# Patient Record
Sex: Female | Born: 1955 | Race: White | Hispanic: No | Marital: Married | Smoking: Former smoker
Health system: Southern US, Community
[De-identification: ages and names within clinical notes are randomized; demographics above are authoritative.]

## PROBLEM LIST (undated history)

## (undated) DIAGNOSIS — I10 Essential (primary) hypertension: Secondary | ICD-10-CM

## (undated) DIAGNOSIS — F32A Depression, unspecified: Secondary | ICD-10-CM

## (undated) DIAGNOSIS — K219 Gastro-esophageal reflux disease without esophagitis: Secondary | ICD-10-CM

## (undated) DIAGNOSIS — J45909 Unspecified asthma, uncomplicated: Secondary | ICD-10-CM

## (undated) DIAGNOSIS — F329 Major depressive disorder, single episode, unspecified: Secondary | ICD-10-CM

## (undated) DIAGNOSIS — M1990 Unspecified osteoarthritis, unspecified site: Secondary | ICD-10-CM

## (undated) DIAGNOSIS — E119 Type 2 diabetes mellitus without complications: Secondary | ICD-10-CM

## (undated) DIAGNOSIS — L98439 Non-pressure chronic ulcer of abdomen with unspecified severity: Secondary | ICD-10-CM

## (undated) DIAGNOSIS — K769 Liver disease, unspecified: Secondary | ICD-10-CM

## (undated) DIAGNOSIS — E7800 Pure hypercholesterolemia, unspecified: Secondary | ICD-10-CM

## (undated) DIAGNOSIS — L98499 Non-pressure chronic ulcer of skin of other sites with unspecified severity: Secondary | ICD-10-CM

## (undated) HISTORY — PX: CHOLECYSTECTOMY: SHX55

## (undated) HISTORY — DX: Liver disease, unspecified: K76.9

## (undated) HISTORY — PX: ABDOMINAL HYSTERECTOMY: SHX81

---
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
## 2001-11-14 ENCOUNTER — Ambulatory Visit (HOSPITAL_COMMUNITY): Admission: RE | Admit: 2001-11-14 | Discharge: 2001-11-14 | Payer: Self-pay | Admitting: General Surgery

## 2001-11-14 ENCOUNTER — Encounter: Payer: Self-pay | Admitting: General Surgery

## 2002-11-18 ENCOUNTER — Encounter: Payer: Self-pay | Admitting: General Surgery

## 2002-11-18 ENCOUNTER — Ambulatory Visit (HOSPITAL_COMMUNITY): Admission: RE | Admit: 2002-11-18 | Discharge: 2002-11-18 | Payer: Self-pay | Admitting: Pediatrics

## 2003-12-02 ENCOUNTER — Ambulatory Visit (HOSPITAL_COMMUNITY): Admission: RE | Admit: 2003-12-02 | Discharge: 2003-12-02 | Payer: Self-pay | Admitting: General Surgery

## 2005-04-04 ENCOUNTER — Ambulatory Visit (HOSPITAL_COMMUNITY): Admission: RE | Admit: 2005-04-04 | Discharge: 2005-04-04 | Payer: Self-pay | Admitting: General Surgery

## 2006-05-14 ENCOUNTER — Ambulatory Visit (HOSPITAL_COMMUNITY): Admission: RE | Admit: 2006-05-14 | Discharge: 2006-05-14 | Payer: Self-pay | Admitting: General Surgery

## 2007-01-14 ENCOUNTER — Emergency Department (HOSPITAL_COMMUNITY): Admission: EM | Admit: 2007-01-14 | Discharge: 2007-01-14 | Payer: Self-pay | Admitting: Emergency Medicine

## 2007-06-28 ENCOUNTER — Ambulatory Visit (HOSPITAL_COMMUNITY): Admission: RE | Admit: 2007-06-28 | Discharge: 2007-06-28 | Payer: Self-pay | Admitting: General Surgery

## 2008-01-31 ENCOUNTER — Observation Stay (HOSPITAL_COMMUNITY): Admission: RE | Admit: 2008-01-31 | Discharge: 2008-02-01 | Payer: Self-pay | Admitting: Internal Medicine

## 2008-01-31 ENCOUNTER — Ambulatory Visit: Payer: Self-pay | Admitting: Internal Medicine

## 2008-01-31 ENCOUNTER — Ambulatory Visit: Payer: Self-pay | Admitting: Urgent Care

## 2008-02-01 ENCOUNTER — Ambulatory Visit: Payer: Self-pay | Admitting: Internal Medicine

## 2008-04-14 ENCOUNTER — Ambulatory Visit: Payer: Self-pay | Admitting: Internal Medicine

## 2008-04-24 ENCOUNTER — Ambulatory Visit: Payer: Self-pay | Admitting: Internal Medicine

## 2008-04-24 ENCOUNTER — Ambulatory Visit (HOSPITAL_COMMUNITY): Admission: RE | Admit: 2008-04-24 | Discharge: 2008-04-24 | Payer: Self-pay | Admitting: Internal Medicine

## 2008-11-25 ENCOUNTER — Ambulatory Visit (HOSPITAL_COMMUNITY): Admission: RE | Admit: 2008-11-25 | Discharge: 2008-11-25 | Payer: Self-pay | Admitting: General Surgery

## 2009-12-10 ENCOUNTER — Ambulatory Visit (HOSPITAL_COMMUNITY): Admission: RE | Admit: 2009-12-10 | Discharge: 2009-12-10 | Payer: Self-pay | Admitting: General Surgery

## 2010-04-17 ENCOUNTER — Inpatient Hospital Stay (HOSPITAL_COMMUNITY): Admission: EM | Admit: 2010-04-17 | Discharge: 2010-04-18 | Payer: Self-pay | Admitting: Emergency Medicine

## 2010-05-20 DIAGNOSIS — R188 Other ascites: Secondary | ICD-10-CM

## 2010-05-20 DIAGNOSIS — Z8719 Personal history of other diseases of the digestive system: Secondary | ICD-10-CM | POA: Insufficient documentation

## 2010-05-20 DIAGNOSIS — Z8711 Personal history of peptic ulcer disease: Secondary | ICD-10-CM | POA: Insufficient documentation

## 2010-05-20 DIAGNOSIS — K449 Diaphragmatic hernia without obstruction or gangrene: Secondary | ICD-10-CM | POA: Insufficient documentation

## 2010-05-20 DIAGNOSIS — E119 Type 2 diabetes mellitus without complications: Secondary | ICD-10-CM

## 2010-07-21 ENCOUNTER — Emergency Department (HOSPITAL_COMMUNITY)
Admission: EM | Admit: 2010-07-21 | Discharge: 2010-07-22 | Payer: Self-pay | Source: Home / Self Care | Admitting: Emergency Medicine

## 2010-07-29 ENCOUNTER — Emergency Department: Payer: Self-pay | Admitting: Emergency Medicine

## 2010-09-04 ENCOUNTER — Encounter: Payer: Self-pay | Admitting: General Surgery

## 2010-10-25 LAB — URINALYSIS, ROUTINE W REFLEX MICROSCOPIC
Bilirubin Urine: NEGATIVE
Ketones, ur: NEGATIVE mg/dL
Urobilinogen, UA: 0.2 mg/dL (ref 0.0–1.0)

## 2010-10-25 LAB — URINE MICROSCOPIC-ADD ON

## 2010-10-25 LAB — POCT I-STAT, CHEM 8
BUN: 16 mg/dL (ref 6–23)
Chloride: 103 mEq/L (ref 96–112)
HCT: 43 % (ref 36.0–46.0)
TCO2: 26 mmol/L (ref 0–100)

## 2010-10-25 LAB — GLUCOSE, CAPILLARY: Glucose-Capillary: 234 mg/dL — ABNORMAL HIGH (ref 70–99)

## 2010-10-27 LAB — DIFFERENTIAL
Basophils Absolute: 0 10*3/uL (ref 0.0–0.1)
Basophils Absolute: 0 10*3/uL (ref 0.0–0.1)
Basophils Relative: 1 % (ref 0–1)
Basophils Relative: 1 % (ref 0–1)
Eosinophils Absolute: 0.2 10*3/uL (ref 0.0–0.7)
Eosinophils Relative: 3 % (ref 0–5)
Eosinophils Relative: 3 % (ref 0–5)
Lymphocytes Relative: 38 % (ref 12–46)
Monocytes Absolute: 0.7 10*3/uL (ref 0.1–1.0)
Monocytes Relative: 9 % (ref 3–12)
Neutro Abs: 4.7 10*3/uL (ref 1.7–7.7)
Neutrophils Relative %: 61 % (ref 43–77)

## 2010-10-27 LAB — POCT CARDIAC MARKERS: CKMB, poc: <1 ng/mL — ABNORMAL LOW (ref 1.0–8.0)

## 2010-10-27 LAB — CK TOTAL AND CKMB (NOT AT ARMC)
CK, MB: 1.1 ng/mL (ref 0.3–4.0)
CK, MB: 1.5 ng/mL (ref 0.3–4.0)
CK, MB: 1.5 ng/mL (ref 0.3–4.0)
Relative Index: INVALID (ref 0.0–2.5)
Relative Index: INVALID (ref 0.0–2.5)
Total CK: 71 U/L (ref 7–177)

## 2010-10-27 LAB — URINALYSIS, ROUTINE W REFLEX MICROSCOPIC
Glucose, UA: NEGATIVE mg/dL
Specific Gravity, Urine: <1.005 — ABNORMAL LOW (ref 1.005–1.030)
pH: 5.5 (ref 5.0–8.0)

## 2010-10-27 LAB — LIPID PANEL
HDL: 48 mg/dL (ref >39)
LDL Cholesterol: 89 mg/dL (ref 0–99)
Total CHOL/HDL Ratio: 4.1 RATIO
Triglycerides: 298 mg/dL — ABNORMAL HIGH (ref <150)
VLDL: 60 mg/dL — ABNORMAL HIGH (ref 0–40)

## 2010-10-27 LAB — BASIC METABOLIC PANEL
BUN: 9 mg/dL (ref 6–23)
Chloride: 105 mEq/L (ref 96–112)
Creatinine, Ser: 0.83 mg/dL (ref 0.4–1.2)
GFR calc non Af Amer: >60 mL/min (ref >60)
Glucose, Bld: 153 mg/dL — ABNORMAL HIGH (ref 70–99)

## 2010-10-27 LAB — COMPREHENSIVE METABOLIC PANEL
ALT: 30 U/L (ref 0–35)
Albumin: 4.2 g/dL (ref 3.5–5.2)
Alkaline Phosphatase: 81 U/L (ref 39–117)
BUN: 15 mg/dL (ref 6–23)
Creatinine, Ser: 0.98 mg/dL (ref 0.4–1.2)
Glucose, Bld: 165 mg/dL — ABNORMAL HIGH (ref 70–99)
Sodium: 135 mEq/L (ref 135–145)
Total Protein: 7.6 g/dL (ref 6.0–8.3)

## 2010-10-27 LAB — CBC
MCHC: 34 g/dL (ref 30.0–36.0)
MCV: 85.9 fL (ref 78.0–100.0)
Platelets: 229 10*3/uL (ref 150–400)
Platelets: 244 10*3/uL (ref 150–400)
RBC: 4.27 MIL/uL (ref 3.87–5.11)
RDW: 14.2 % (ref 11.5–15.5)
RDW: 14.2 % (ref 11.5–15.5)
WBC: 6 10*3/uL (ref 4.0–10.5)

## 2010-10-27 LAB — GLUCOSE, CAPILLARY
Glucose-Capillary: 103 mg/dL — ABNORMAL HIGH (ref 70–99)
Glucose-Capillary: 143 mg/dL — ABNORMAL HIGH (ref 70–99)
Glucose-Capillary: 156 mg/dL — ABNORMAL HIGH (ref 70–99)

## 2010-10-27 LAB — HEMOCCULT GUIAC POC 1CARD (OFFICE)
Fecal Occult Bld: NEGATIVE
Fecal Occult Bld: NEGATIVE

## 2010-10-27 LAB — MRSA PCR SCREENING: MRSA by PCR: NEGATIVE

## 2010-10-27 LAB — URINE CULTURE: Culture  Setup Time: 201109042126

## 2010-10-27 LAB — PROTIME-INR
INR: 0.96 (ref 0.00–1.49)
Prothrombin Time: 12.7 seconds (ref 11.6–15.2)
Prothrombin Time: 13 seconds (ref 11.6–15.2)

## 2010-10-27 LAB — TSH: TSH: 1.308 u[IU]/mL (ref 0.350–4.500)

## 2010-10-27 LAB — TYPE AND SCREEN: ABO/RH(D): O POS

## 2010-11-21 ENCOUNTER — Other Ambulatory Visit (HOSPITAL_COMMUNITY): Payer: Self-pay | Admitting: General Surgery

## 2010-11-21 DIAGNOSIS — Z139 Encounter for screening, unspecified: Secondary | ICD-10-CM

## 2010-12-12 ENCOUNTER — Ambulatory Visit (HOSPITAL_COMMUNITY)
Admission: RE | Admit: 2010-12-12 | Discharge: 2010-12-12 | Disposition: A | Discharge status: Home / Self Care | Payer: BC Managed Care – PPO | Source: Ambulatory Visit | Attending: General Surgery | Admitting: General Surgery

## 2010-12-12 DIAGNOSIS — Z139 Encounter for screening, unspecified: Secondary | ICD-10-CM

## 2010-12-12 DIAGNOSIS — Z1231 Encounter for screening mammogram for malignant neoplasm of breast: Secondary | ICD-10-CM | POA: Insufficient documentation

## 2010-12-27 NOTE — Consult Note (Signed)
Heather Carter, Heather Carter              ACCOUNT NO.:  [id number]   MEDICAL RECORD NO.:  [id number]          PATIENT TYPE:  AMB   LOCATION:  DAY                           FACILITY:  [facility]   PHYSICIAN:  R. Roetta Sessions, M.D. DATE OF BIRTH:  [birth year]   DATE OF CONSULTATION:  01/31/2008  DATE OF DISCHARGE:                                 CONSULTATION   REQUESTING PHYSICIAN:  Terrilee Files, NP, [facility].   REASON FOR CONSULTATION:  Melena.   HISTORY OF PRESENT ILLNESS:  Heather Carter is a 55 year old Caucasian  female with a 2-day history of melena.  She began to notice black tarry  stools approximately 2 days ago.  She is having anywhere from 5-6 dark  tarry stools per day.  She has been having some shortness of breath on  exertion and weakness as well as palpitations which is what led her to  see someone yesterday.  She was found to have a hemoglobin of 10.7.  STAT CBC today showed a hemoglobin of 10.1.  She did give blood on January 14, 2008, as well prior to the onset of her melena.  She complains of  epigastric tenderness and a pressure.  At first, she felt like she was  having anxiety.  She complains of nausea but denies any vomiting.  She  has had heartburn and indigestion.  She has been taking Prilosec 20 mg  daily for about 3 weeks now.  She does have some odynophagia as well as  dysphagia.  She feels like there is a lump when she swallows, and she  points midsternally.  She is having problems with both solids and  liquids.  She typically has 2-3 soft brown bowel movements per day.  Her  stools have been very irregular over the last 3 weeks.  She take 2 Aleve  just prior to the onset of her melena, and then she took 2 Aleve again  yesterday for left thumb pain.  She last ate bananas and Slim-Fast about  7:30 a.m.  She was found to be hemoccult positive at [facility].   PAST MEDICAL AND SURGICAL HISTORY:  1. Hypertension.  2. Diabetes.  3.  Lower extremity edema.  4. Hiatal hernia.  5. She had a cholecystectomy in 1997 for cholelithiasis.  6. She had a complete hysterectomy in 1989.   CURRENT MEDICATIONS:  1. Atenolol 50 mg daily.  2. Triamterine/HCTZ unknown dose daily.  3. Metformin 1 g daily.  4. Aleve p.r.n.  5. Prilosec 20 mg daily p.r.n.   ALLERGIES:  CODEINE.   FAMILY HISTORY:  Positive for a brother diagnosed with colon cancer at  age 35.  Mother deceased at age 47 secondary to coronary artery disease.  Father deceased at age 27 for the same.  She has 4 otherwise healthy  siblings.   SOCIAL HISTORY:  Heather Carter is married.  She has 3 grown children.  She is unemployed.  She denies any alcohol or drug use.  She has a  remote history of tobacco use.   REVIEW OF SYSTEMS:  See HPI, otherwise negative.   PHYSICAL EXAM:  VITAL SIGNS:  Weight 199, height 65 inches, temperature  98.3, blood pressure 132/84, pulse 92.  GENERAL:  She is a well-developed, well-nourished, pale-appearing  Caucasian female who is alert, oriented, pleasant and cooperative,  although somewhat anxious.  HEENT:  Sclerae clear, nonicteric.  Conjunctiva pale.  Oropharynx pink  and moist without any lesions.  NECK:  Supple without any masses or thyromegaly.  CHEST:  Heart regular rate and rhythm.  Normal S1-S2 without murmurs,  clicks, rubs or gallops.  LUNGS:  Clear to auscultation bilaterally.  ABDOMEN:  Positive bowel sounds x4.  No bruits auscultated.  She also  has mild midabdominal tenderness to the left and right lower quadrants  just below the umbilicus.  There is no rebound tenderness or guarding.  No hepatosplenomegaly or mass.  EXTREMITIES:  Without clubbing or edema bilaterally.   IMPRESSION:  Heather Carter is a 55 year old Caucasian female with a 2-day  history of melena which began after taking nonsteroidal anti-  inflammatory drugs.  This is suspicious for peptic ulcer disease.  She  could also have a small bowel injury.   She does have symptoms of  heartburn, indigestion, etc.  She also has a family history of colon  cancer and is going to need colonoscopy at a later time.   PLAN:  1. Discussed with Dr. Jena Gauss, and she will present to an endoscopy for      an urgent EGD.  2. Further recommendations pending procedure.   Thank you for allowing [place] to participate in the care of Heather Carter.      Lorenza Burton, N.P.      Jonathon Bellows, M.D.  Electronically Signed    KJ/MEDQ  D:  01/31/2008  T:  01/31/2008  Job:  [id number]   cc:   Terrilee Files, [facility]

## 2010-12-27 NOTE — Op Note (Signed)
Heather, Carter              ACCOUNT NO.:  [id number]   MEDICAL RECORD NO.:  [id number]          PATIENT TYPE:  OBV   LOCATION:  A313                          FACILITY:  [facility]   PHYSICIAN:  R. Roetta Sessions, M.D. DATE OF BIRTH:  02-11-1956   DATE OF PROCEDURE:  DATE OF DISCHARGE:                               OPERATIVE REPORT   INDICATIONS FOR PROCEDURE:  Heather Carter is a pleasant 55 year old  lady referred by Heather Carter, nurse practitioner, at [facility] after history of black tarry stools.  She had been taking  some Aleve recently, was previously taking headache powders as well.  She also had some vague shortness of breath.  CBC today showed a  hemoglobin of 10.7.  She gave blood to the [employer] on  January 14, 2008.  She does have some chronic indigestion and takes Prilosec intermittently  and described some occasional dysphagia and odynophagia.  She was found  be Hemoccult positive at the [facility] today.   EGD now being done.  This approach has been discussed with the patient  at length.  Potential risks, benefits, alternatives, and limitations  have been reviewed.  Questions answered.  She is agreeable.  Please see  Dr. Luvenia Starch medical record.   PROCEDURE NOTE:  O2 saturation, blood pressure, pulse, respirations were  monitored throughout the entirety of the procedure.  Conscious sedation  of Versed 4 mg IV, Demerol 100 g IV in divided doses.  Cetacaine spray  for topical pharyngeal anesthesia.   INSTRUMENT:  Pentax video chip system.   FINDINGS:  Examination of the tubular esophagus revealed circumferential  distal esophageal erosions at the EG junction.  There was also a  Schatzki's ring.  There was no Barrett's esophagus or other abnormality.  EG junction was easily traversed in the stomach.  Gastric cavity was  emptied and insufflated well with air.  Thorough examination of the  gastric mucosa including retroflexed  view of the proximal stomach,  esophagogastric junction demonstrated a small-to-moderate size hiatal  hernia.  In the antrum, the patient had multiple ulcerations and  satellite erosions.  There was a 8-mm deep ulcer with adhered clot at  the base.  Please see photos.  This was suspect lesion for bleeding.  These appeared be benign lesions.  Pylorus was patent, easily traversed.  Examination of the bulb and second portion revealed no abnormalities.  Therapeutic/diagnostic maneuvers performed.  The ulcer crater with  bleeding stigmata was thermally sealed with 4 applications of the gold  probe at 25 J each.  This associated with excellent hemostasis without  apparent complication.   We elected not to dilate the ring today.  The patient tolerated the  procedure well and was reacted in endoscopy.   IMPRESSION:  Circumferential distal esophageal erosions consistent with  erosive reflux esophagitis, superimposed Schatzki's ring, not  manipulated; small-to-moderate size hiatal hernia, multiple gastric  ulcers, one with bleeding stigmata (adherent clot status post thermal  sealing as described above), patent pylorus, normal D1 D2.   RECOMMENDATIONS:  1. We will place [name] on a  24-hour observation.  We will watch      her overnight.  We will allow clear liquid diet.  2. We will check BMET, CBC tomorrow morning.  We will also obtain H.      pylori serologies.  She is to refrain from taking any nonsteroidals      in the future.  She will need to be on a daily PPI.  3. We will plan to bring this nice lady back in 3 months for followup      EGD to assess ulcer healing.  At that time, we can dilate her      Schatzki's ring as well and will also perform a screening      colonoscopy at that time.      Jonathon Bellows, M.D.  Electronically Signed     RMR/MEDQ  D:  01/31/2008  T:  02/01/2008  Job:  [id number]   cc:   Heather Cobb, FNP  [facility]

## 2010-12-27 NOTE — Op Note (Signed)
NAMEMARYCLARE, NYDAM              ACCOUNT NO.:  [id number]   MEDICAL RECORD NO.:  [id number]          PATIENT TYPE:  AMB   LOCATION:  DAY                           FACILITY:  [facility]   PHYSICIAN:  R. Roetta Sessions, M.D. DATE OF BIRTH:  1955-12-12   DATE OF PROCEDURE:  DATE OF DISCHARGE:                               OPERATIVE REPORT   INDICATIONS FOR PROCEDURE:  A 55 year old lady with a history of upper  GI bleed secondary to multiple gastric ulcers, NSAID related, this past  summer.  She required therapeutic endoscopy for bleeding control.  She  is refrained from taking nonsteroidals.  Workup for H. pylori negative.  She is here for surveillance.  She has positive family history of colon  cancer in a first-degree relative, who succumbed to the disease at a  young age.  She has no lower GI tract symptoms, but comes for her first  ever screening colonoscopy.  Risks, benefits, alternatives, and approach  have been reviewed, questions answered.  She is agreeable.  Please see  documentation in the medical record.   PROCEDURE NOTE:  O2 saturation, blood pressure, pulse, and respirations  were monitored throughout the entirety of procedure.   CONSCIOUS SEDATION:  Versed 6 mg IV and Demerol 100 g IV in divided  doses.   INSTRUMENT:  Pentax video chip system.   FINDINGS:  Examination of the tubular esophagus revealed a noncritical  Schatzki's ring.  Otherwise, esophageal mucosa appeared normal.  EG  junction easily traversed.  Stomach:  Gas cavity was emptied insufflated  well with air.  Thorough examination of the gastric mucosa including  retroflexion of the proximal stomach esophagogastric junction  demonstrated hiatal hernia and some antral scar.  Previously noted  gastric ulcers were completely healed.  Pylorus was patent, easily  traversed.  Examination of the bulb and second portion revealed no  abnormalities.  Therapeutic/diagnostic maneuvers performed, none.  The  patient  tolerated the procedure well.   The colonoscopy and digital rectal exam revealed no abnormalities.   ENDOSCOPIC FINDINGS:  Prep was adequate.  Colon:  The colonic mucosa was  surveyed from the rectosigmoid junction through the left transverse,  right colon appendiceal orifice, ileocecal valve, and cecum.  These  structures were well seen and photographed for the record.  From this  level, the scope was slowly and cautiously withdrawn.  All previously  mentioned mucosal surfaces were again seen.  The colonic mucosa appeared  entirely normal.  The scope was pulled down into the rectum, where  thorough examination of the rectal mucosa including retroflexed view of  the anal verge demonstrated only anal canal hemorrhoids.  The patient  tolerated the procedures well and was reactive in endoscopy.   IMPRESSION:  EGD, noncritical Schatzki's ring, not manipulated,  otherwise normal esophagus, hiatal hernia, antral scarring, previously  noted gastric ulcers completely healed, patent pylorus, normal D1 and  D2.   COLONOSCOPY FINDINGS:  Anal canal hemorrhoids, otherwise normal  colon/rectum.   RECOMMENDATIONS:  1. Continue Omeprazole 20 mg orally daily for gastroesophageal reflux      disease.  2. Consider repeat  high-risk screening colonoscopy in 5 years' time.      Jonathon Bellows, M.D.  Electronically Signed     RMR/MEDQ  D:  04/24/2008  T:  04/25/2008  Job:  [id number]   cc:   [facility]  [facility]

## 2010-12-27 NOTE — H&P (Signed)
NAMEARIYANNAH, PAULING              ACCOUNT NO.:  [id number]   MEDICAL RECORD NO.:  [id number]          PATIENT TYPE:  AMB   LOCATION:  DAY                           FACILITY:  [facility]   PHYSICIAN:  R. Roetta Sessions, M.D. DATE OF BIRTH:  1956/01/10   DATE OF ADMISSION:  DATE OF DISCHARGE:  LH                              HISTORY & PHYSICAL   CHIEF COMPLAINT:  History of upper GI bleed secondary to multiple  gastric ulcers.   Ms. Mink is a 55-year lady previous NSAID use who presented with  melena and anemia back in June of this year.  We saw her in consultation  at the request of [facility] folks and she underwent an  urgent EGD by me on January 31, 2008, where she demonstrated  circumferential distal esophageal erosions, superimposed Schatzki's  ring, not manipulated and hiatal hernia.  She also had multiple gastric  ulcers one with bleeding stigmata, which was treated endoscopically.  She has done very well.  Her H. pylori serologies came back negative.  She has not had any melena or abdominal pain.  Reflux symptoms subsided  on omeprazole 20 mg orally daily.  She is due for surveillance EGD, at  this time documented healing of a gastric ulcer disease.  It is also  notable Ms. Seide is a 24 and has a positive family history of colon  cancer and a first-degree relative is succumbed to disease within months  of diagnosis of age 14.  She has desired for colonoscopy, she has never  had one previously.  She is currently not having any lower GI tract  symptoms such as hematochezia, altered bowel function, or abdominal  pain.   PAST MEDICAL HISTORY:  1. Hypertension.  2. Diabetes.  3. Lower extremity edema.  4. Hiatal hernia, status post cholecystectomy 1997, cholelithiasis      status post hysterectomy.   CURRENT MEDICATIONS:  1. Atenolol 50 mg daily.  2. Triamterene and HCTZ daily.  3. Metformin 1000 mg daily.  4. Aleve, not taking anymore.  5. Omeprazole 20 mg  daily.   ALLERGIES:  CODEINE.   FAMILY HISTORY:  Positive for brother who was diagnosed with colon  cancer at age 63.   SOCIAL HISTORY:  The patient is married, has 3 grandchildren.  She is  unemployed.  No alcohol or tobacco use currently.   REVIEW OF SYSTEMS:  No chest pain and dyspnea on exertion.  No fever,  chills, or change in weight.   PHYSICAL EXAMINATION:  GENERAL:  Pleasant 55-year lady resting  comfortably.  VITAL SIGNS:  Weight 198, height 5 feet 5, temp 94, BP 122/72, pulse 64.  SKIN:  Warm and dry.  HEENT:  No scleral icterus.  Conjunctivae are pink.  CHEST:  Lungs are clear to auscultation.  CARDIAC:  Regular rate and rhythm without murmur, gallop, or rub.  ABDOMEN:  Nondistended.  Positive bowel sounds.  Soft and nontender  without appreciable mass or organomegaly.  EXTREMITIES:  No edema.  RECTAL:  Deferred to the time of colonoscopy.   IMPRESSION:  Ms. Linch is a very  pleasant 55 year old lady with  history of upper gastrointestinal bleed secondary to multiple gastric  ulcers likely nonsteroidal antiinflammatory drug related back in June of  this year.  She has also history of erosive reflux esophagitis, symptoms  now well-controlled on omeprazole 20 mg orally daily, has positive  family history of colon cancer first-degree relatives, and not yet had a  colonoscopy.   RECOMMENDATIONS:  I have offered Ms. Vint surveillance EGD and first  ever screening colonoscopy in the near future at [facility].  Risks, benefits, alternatives, and limitations have been reviewed.  Questions answered.  She is agreeable.  We will plan to perform EGD and  colonoscopy in the near future at [facility]  Further  recommendations to follow.      Jonathon Bellows, M.D.  Electronically Signed     RMR/MEDQ  D:  04/14/2008  T:  04/15/2008  Job:  [id number]   cc:   [facility]  [place]  [place]

## 2011-05-11 LAB — CBC
MCHC: 34.1
MCV: 86.7
Platelets: 269
RBC: 3.05 — ABNORMAL LOW
RDW: 13.7

## 2011-05-11 LAB — BASIC METABOLIC PANEL
BUN: 13
CO2: 29
Calcium: 9.3
Chloride: 102
Creatinine, Ser: 0.82
GFR calc Af Amer: >60
Glucose, Bld: 171 — ABNORMAL HIGH

## 2011-05-11 LAB — DIFFERENTIAL
Basophils Absolute: 0
Basophils Relative: 1
Eosinophils Absolute: 0.2
Eosinophils Relative: 4
Lymphocytes Relative: 36
Lymphs Abs: 2
Monocytes Absolute: 0.4
Monocytes Relative: 8
Neutro Abs: 2.9
Neutrophils Relative %: 52

## 2011-05-11 LAB — HEMOGLOBIN AND HEMATOCRIT, BLOOD
HCT: 26.7 — ABNORMAL LOW
Hemoglobin: 9.1 — ABNORMAL LOW

## 2011-05-11 LAB — H. PYLORI ANTIBODY, IGG: H Pylori IgG: <0.4

## 2011-06-01 LAB — DIFFERENTIAL
Lymphocytes Relative: 19
Lymphs Abs: 1.6
Monocytes Relative: 6
Neutro Abs: 6.1
Neutrophils Relative %: 73

## 2011-06-01 LAB — URINALYSIS, ROUTINE W REFLEX MICROSCOPIC
Glucose, UA: NEGATIVE
Protein, ur: NEGATIVE
Urobilinogen, UA: 0.2

## 2011-06-01 LAB — BASIC METABOLIC PANEL
Calcium: 9.5
Creatinine, Ser: 0.7
GFR calc Af Amer: >60

## 2011-06-01 LAB — CBC
RBC: 4.67
WBC: 8.3

## 2011-12-28 ENCOUNTER — Other Ambulatory Visit (HOSPITAL_COMMUNITY): Payer: Self-pay | Admitting: Internal Medicine

## 2011-12-28 DIAGNOSIS — Z1231 Encounter for screening mammogram for malignant neoplasm of breast: Secondary | ICD-10-CM

## 2011-12-29 ENCOUNTER — Ambulatory Visit (HOSPITAL_COMMUNITY)
Admission: RE | Admit: 2011-12-29 | Discharge: 2011-12-29 | Disposition: A | Discharge status: Home / Self Care | Payer: Self-pay | Source: Ambulatory Visit | Attending: Internal Medicine | Admitting: Internal Medicine

## 2011-12-29 DIAGNOSIS — Z1231 Encounter for screening mammogram for malignant neoplasm of breast: Secondary | ICD-10-CM

## 2013-03-24 ENCOUNTER — Encounter: Payer: Self-pay | Admitting: Internal Medicine

## 2013-04-28 ENCOUNTER — Other Ambulatory Visit (HOSPITAL_COMMUNITY): Payer: Self-pay | Admitting: Internal Medicine

## 2013-04-28 DIAGNOSIS — Z139 Encounter for screening, unspecified: Secondary | ICD-10-CM

## 2013-04-29 ENCOUNTER — Ambulatory Visit (HOSPITAL_COMMUNITY)
Admission: RE | Admit: 2013-04-29 | Discharge: 2013-04-29 | Disposition: A | Discharge status: Home / Self Care | Payer: BC Managed Care – PPO | Source: Ambulatory Visit | Attending: Internal Medicine | Admitting: Internal Medicine

## 2013-04-29 DIAGNOSIS — Z1231 Encounter for screening mammogram for malignant neoplasm of breast: Secondary | ICD-10-CM | POA: Insufficient documentation

## 2013-04-29 DIAGNOSIS — Z139 Encounter for screening, unspecified: Secondary | ICD-10-CM

## 2013-06-09 ENCOUNTER — Telehealth: Payer: Self-pay | Admitting: *Deleted

## 2013-06-09 NOTE — Telephone Encounter (Signed)
Pt called and stated she got a letter to make a appt. Pt wants to find out how much her copay is on [employer] care before she makes a appt.

## 2014-05-21 DIAGNOSIS — I10 Essential (primary) hypertension: Secondary | ICD-10-CM | POA: Insufficient documentation

## 2014-05-21 DIAGNOSIS — F411 Generalized anxiety disorder: Secondary | ICD-10-CM | POA: Insufficient documentation

## 2014-05-25 ENCOUNTER — Other Ambulatory Visit (HOSPITAL_COMMUNITY): Payer: Self-pay | Admitting: Internal Medicine

## 2014-05-25 DIAGNOSIS — Z1231 Encounter for screening mammogram for malignant neoplasm of breast: Secondary | ICD-10-CM

## 2014-06-05 ENCOUNTER — Ambulatory Visit (HOSPITAL_COMMUNITY): Payer: BC Managed Care – PPO

## 2014-06-12 ENCOUNTER — Ambulatory Visit (HOSPITAL_COMMUNITY)
Admission: RE | Admit: 2014-06-12 | Discharge: 2014-06-12 | Disposition: A | Discharge status: Home / Self Care | Payer: Medicare Other | Source: Ambulatory Visit | Attending: Internal Medicine | Admitting: Internal Medicine

## 2014-06-12 DIAGNOSIS — Z1231 Encounter for screening mammogram for malignant neoplasm of breast: Secondary | ICD-10-CM | POA: Diagnosis present

## 2014-06-25 ENCOUNTER — Ambulatory Visit: Payer: Self-pay | Admitting: Internal Medicine

## 2015-03-25 ENCOUNTER — Emergency Department
Admission: EM | Admit: 2015-03-25 | Discharge: 2015-03-25 | Disposition: A | Discharge status: Home / Self Care | Payer: No Typology Code available for payment source | Attending: Student | Admitting: Student

## 2015-03-25 DIAGNOSIS — Y998 Other external cause status: Secondary | ICD-10-CM | POA: Diagnosis not present

## 2015-03-25 DIAGNOSIS — Y92410 Unspecified street and highway as the place of occurrence of the external cause: Secondary | ICD-10-CM | POA: Diagnosis not present

## 2015-03-25 DIAGNOSIS — I10 Essential (primary) hypertension: Secondary | ICD-10-CM | POA: Insufficient documentation

## 2015-03-25 DIAGNOSIS — E119 Type 2 diabetes mellitus without complications: Secondary | ICD-10-CM | POA: Diagnosis not present

## 2015-03-25 DIAGNOSIS — Y9389 Activity, other specified: Secondary | ICD-10-CM | POA: Diagnosis not present

## 2015-03-25 DIAGNOSIS — S161XXA Strain of muscle, fascia and tendon at neck level, initial encounter: Secondary | ICD-10-CM | POA: Diagnosis not present

## 2015-03-25 DIAGNOSIS — S199XXA Unspecified injury of neck, initial encounter: Secondary | ICD-10-CM | POA: Diagnosis present

## 2015-03-25 HISTORY — DX: Type 2 diabetes mellitus without complications: E11.9

## 2015-03-25 HISTORY — DX: Pure hypercholesterolemia, unspecified: E78.00

## 2015-03-25 HISTORY — DX: Unspecified osteoarthritis, unspecified site: M19.90

## 2015-03-25 HISTORY — DX: Essential (primary) hypertension: I10

## 2015-03-25 MED ORDER — CYCLOBENZAPRINE HCL 5 MG PO TABS: 5.0000 mg | ORAL_TABLET | Freq: Three times a day (TID) | ORAL | Status: DC | PRN

## 2015-03-25 MED ORDER — NAPROXEN 500 MG PO TABS: 500.0000 mg | ORAL_TABLET | Freq: Two times a day (BID) | ORAL | Status: AC
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
## 2015-05-30 ENCOUNTER — Encounter (HOSPITAL_COMMUNITY): Payer: Self-pay | Admitting: Emergency Medicine

## 2015-05-30 ENCOUNTER — Emergency Department (HOSPITAL_COMMUNITY): Payer: Medicare Other

## 2015-05-30 ENCOUNTER — Emergency Department (HOSPITAL_COMMUNITY)
Admission: EM | Admit: 2015-05-30 | Discharge: 2015-05-31 | Disposition: A | Discharge status: Home / Self Care | Payer: Medicare Other | Attending: Emergency Medicine | Admitting: Emergency Medicine

## 2015-05-30 DIAGNOSIS — Y9389 Activity, other specified: Secondary | ICD-10-CM | POA: Insufficient documentation

## 2015-05-30 DIAGNOSIS — Z791 Long term (current) use of non-steroidal anti-inflammatories (NSAID): Secondary | ICD-10-CM | POA: Insufficient documentation

## 2015-05-30 DIAGNOSIS — Y9289 Other specified places as the place of occurrence of the external cause: Secondary | ICD-10-CM | POA: Insufficient documentation

## 2015-05-30 DIAGNOSIS — Z8639 Personal history of other endocrine, nutritional and metabolic disease: Secondary | ICD-10-CM | POA: Insufficient documentation

## 2015-05-30 DIAGNOSIS — E119 Type 2 diabetes mellitus without complications: Secondary | ICD-10-CM | POA: Insufficient documentation

## 2015-05-30 DIAGNOSIS — W098XXA Fall on or from other playground equipment, initial encounter: Secondary | ICD-10-CM | POA: Insufficient documentation

## 2015-05-30 DIAGNOSIS — I10 Essential (primary) hypertension: Secondary | ICD-10-CM | POA: Diagnosis not present

## 2015-05-30 DIAGNOSIS — S161XXA Strain of muscle, fascia and tendon at neck level, initial encounter: Secondary | ICD-10-CM | POA: Diagnosis not present

## 2015-05-30 DIAGNOSIS — M1990 Unspecified osteoarthritis, unspecified site: Secondary | ICD-10-CM | POA: Diagnosis not present

## 2015-05-30 DIAGNOSIS — Y998 Other external cause status: Secondary | ICD-10-CM | POA: Diagnosis not present

## 2015-05-30 DIAGNOSIS — S199XXA Unspecified injury of neck, initial encounter: Secondary | ICD-10-CM | POA: Diagnosis present
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
## 2015-05-31 MED ORDER — IBUPROFEN 800 MG PO TABS: 800.0000 mg | ORAL_TABLET | Freq: Three times a day (TID) | ORAL | Status: DC

## 2015-05-31 MED ORDER — CYCLOBENZAPRINE HCL 10 MG PO TABS: 10.0000 mg | ORAL_TABLET | Freq: Three times a day (TID) | ORAL | Status: DC | PRN

## 2015-05-31 MED ORDER — IBUPROFEN 800 MG PO TABS
800.0000 mg | ORAL_TABLET | Freq: Once | ORAL | Status: AC
  Administered 2015-05-31: 800 mg via ORAL
  Filled 2015-05-31: qty 1

## 2015-05-31 MED ORDER — CYCLOBENZAPRINE HCL 10 MG PO TABS
10.0000 mg | ORAL_TABLET | Freq: Once | ORAL | Status: AC
  Administered 2015-05-31: 10 mg via ORAL
  Filled 2015-05-31: qty 1

## 2015-05-31 NOTE — Discharge Instructions (Signed)

## 2015-05-31 NOTE — ED Provider Notes (Signed)
CSN: [id number]     Arrival date & time 05/30/15  2138 History   First MD Initiated Contact with Patient 05/30/15 2150     Chief Complaint  Patient presents with  . Neck Pain     (Consider location/radiation/quality/duration/timing/severity/associated sxs/prior Treatment) HPI   Heather Carter is a 59 y.o. female who presents to the Emergency Department complaining of neck pain after falling off the "monkey bars" earlier this evening.  She states that she fell approximately 3 ft and landed on her neck.  She reports pain to her neck with movement.  Improves at rest.  She took tylenol prior to arrival with relief.  She denies head injury, LOC, swelling, numbness or weakness of the upper extremities, headache, dizziness, visual changes and low back pain.     Past Medical History  Diagnosis Date  . Hypertension   . Diabetes mellitus without complication ([place])   . Arthritis   . High cholesterol    Past Surgical History  Procedure Laterality Date  . Abdominal hysterectomy    . Cholecystectomy     History reviewed. No pertinent family history. Social History  Substance Use Topics  . Smoking status: Never Smoker   . Smokeless tobacco: Never Used  . Alcohol Use: No   OB History    No data available     Review of Systems  Constitutional: Negative for fever.  Respiratory: Negative for shortness of breath.   Cardiovascular: Negative for chest pain.  Gastrointestinal: Negative for vomiting, abdominal pain and constipation.  Genitourinary: Negative for dysuria and difficulty urinating.  Musculoskeletal: Positive for neck pain. Negative for back pain and joint swelling.  Skin: Negative for rash.  Neurological: Negative for dizziness, syncope, weakness, numbness and headaches.  All other systems reviewed and are negative.     Allergies  Codeine  Home Medications   Prior to Admission medications   Medication Sig Start Date End Date Taking? Authorizing Provider   cyclobenzaprine (FLEXERIL) 10 MG tablet Take 1 tablet (10 mg total) by mouth 3 (three) times daily as needed. 05/31/15   Ashelynn Marks, PA-C  ibuprofen (ADVIL,MOTRIN) 800 MG tablet Take 1 tablet (800 mg total) by mouth 3 (three) times daily. 05/31/15   Kerah Hardebeck, PA-C  naproxen (NAPROSYN) 500 MG tablet Take 1 tablet (500 mg total) by mouth 2 (two) times daily with a meal. 03/25/15 03/24/16  Cari B Alyna Stensland, FNP   BP 143/63 mmHg  Pulse 78  Temp(Src) 98.1 F (36.7 C) (Oral)  Resp 16  Ht 5\' 6"  (1.676 m)  Wt 183 lb (83.008 kg)  BMI 29.55 kg/m2  SpO2 99% Physical Exam  Constitutional: She is oriented to person, place, and time. She appears well-developed and well-nourished. No distress.  HENT:  Head: Normocephalic and atraumatic.  Mouth/Throat: Oropharynx is clear and moist.  Eyes: EOM are normal. Pupils are equal, round, and reactive to light.  Neck: Phonation normal. Spinous process tenderness and muscular tenderness present. No rigidity. No erythema and normal range of motion present. No Brudzinski's sign and no Kernig's sign noted. No thyromegaly present.     Cardiovascular: Normal rate, regular rhythm, normal heart sounds and intact distal pulses.   No murmur heard. Pulmonary/Chest: Effort normal and breath sounds normal. No respiratory distress. She exhibits no tenderness.  Musculoskeletal: She exhibits tenderness. She exhibits no edema.       Cervical back: She exhibits tenderness. She exhibits normal range of motion, no bony tenderness, no swelling, no deformity, no spasm and normal pulse.  ttp of the cervical spine and bilateral paraspinal muscles and along the bilateral trapezius muscles.  Grip strength is strong and equal bilaterally.  No drift, 5/5 strength against resistance of the bilateral UE's.  Distal sensation intact,  CR < 2 sec.     Lymphadenopathy:    She has no cervical adenopathy.  Neurological: She is alert and oriented to person, place, and time. She has normal  strength. No sensory deficit. She exhibits normal muscle tone. Coordination normal.  Reflex Scores:      Tricep reflexes are 2+ on the right side and 2+ on the left side.      Bicep reflexes are 2+ on the right side and 2+ on the left side. Skin: Skin is warm and dry.  Nursing note and vitals reviewed.   ED Course  Procedures (including critical care time) Labs Review Labs Reviewed - No data to display  Imaging Review Dg Cervical Spine Complete  05/31/2015  CLINICAL DATA:  Neck pain after fall. Patient reports doing a flip on monkey bars, landing on her head, now with neck pain. EXAM: CERVICAL SPINE  4+ VIEWS COMPARISON:  None. FINDINGS: Cervical spine alignment is maintained. Vertebral body heights are preserved. The dens is intact. Posterior elements appear well-aligned. There is no evidence of fracture. There is moderate facet arthropathy in the mid cervical spine leading to mild bilateral neural foraminal stenosis at multiple levels. Mild endplate spurring throughout, with minimal disc space narrowing at C5-C6. No prevertebral soft tissue edema. IMPRESSION: 1. No evidence of acute fracture or subluxation. 2. Facet arthropathy and mild degenerative disc disease. Electronically Signed   By: Jeb Levering M.D.   On: 05/31/2015 00:07   I have personally reviewed and evaluated these images and lab results as part of my medical decision-making.   EKG Interpretation None      MDM   Final diagnoses:  Cervical strain, acute, initial encounter    Pt is well appearing.  No focal neuro deficits, no motor weakness on exam.  XR neg for fx.  Pain improved after ice.  Pt agrees to symptomatic tx and close PMD f/u if not improving.  She appears stable for d/c    Kem Parkinson, PA-C 05/31/15 [facility], DO 06/02/15 2106

## 2015-10-13 ENCOUNTER — Ambulatory Visit: Payer: Medicare Other | Attending: Internal Medicine

## 2015-10-13 DIAGNOSIS — G4733 Obstructive sleep apnea (adult) (pediatric): Secondary | ICD-10-CM | POA: Diagnosis not present

## 2015-10-13 DIAGNOSIS — R0683 Snoring: Secondary | ICD-10-CM | POA: Diagnosis present

## 2015-10-25 ENCOUNTER — Other Ambulatory Visit (HOSPITAL_COMMUNITY): Payer: Self-pay | Admitting: Internal Medicine

## 2015-10-25 DIAGNOSIS — Z1231 Encounter for screening mammogram for malignant neoplasm of breast: Secondary | ICD-10-CM

## 2015-10-26 ENCOUNTER — Ambulatory Visit: Payer: Medicare Other | Attending: Internal Medicine

## 2015-10-27 ENCOUNTER — Ambulatory Visit (HOSPITAL_COMMUNITY)
Admission: RE | Admit: 2015-10-27 | Discharge: 2015-10-27 | Disposition: A | Discharge status: Home / Self Care | Payer: Medicare Other | Source: Ambulatory Visit | Attending: Internal Medicine | Admitting: Internal Medicine

## 2015-10-27 DIAGNOSIS — Z1231 Encounter for screening mammogram for malignant neoplasm of breast: Secondary | ICD-10-CM | POA: Diagnosis not present

## 2017-01-03 ENCOUNTER — Other Ambulatory Visit (HOSPITAL_COMMUNITY): Payer: Self-pay | Admitting: Internal Medicine

## 2017-01-03 DIAGNOSIS — Z1231 Encounter for screening mammogram for malignant neoplasm of breast: Secondary | ICD-10-CM

## 2017-01-10 ENCOUNTER — Ambulatory Visit (HOSPITAL_COMMUNITY)
Admission: RE | Admit: 2017-01-10 | Discharge: 2017-01-10 | Disposition: A | Discharge status: Home / Self Care | Payer: Medicare Other | Source: Ambulatory Visit | Attending: Internal Medicine | Admitting: Internal Medicine

## 2017-01-10 DIAGNOSIS — Z1231 Encounter for screening mammogram for malignant neoplasm of breast: Secondary | ICD-10-CM

## 2017-05-14 ENCOUNTER — Emergency Department (HOSPITAL_COMMUNITY): Payer: No Typology Code available for payment source

## 2017-05-14 ENCOUNTER — Emergency Department (HOSPITAL_COMMUNITY)
Admission: EM | Admit: 2017-05-14 | Discharge: 2017-05-14 | Disposition: A | Discharge status: Home / Self Care | Payer: No Typology Code available for payment source | Attending: Emergency Medicine | Admitting: Emergency Medicine

## 2017-05-14 ENCOUNTER — Encounter (HOSPITAL_COMMUNITY): Payer: Self-pay | Admitting: Emergency Medicine

## 2017-05-14 DIAGNOSIS — I10 Essential (primary) hypertension: Secondary | ICD-10-CM | POA: Diagnosis not present

## 2017-05-14 DIAGNOSIS — E119 Type 2 diabetes mellitus without complications: Secondary | ICD-10-CM | POA: Insufficient documentation

## 2017-05-14 DIAGNOSIS — E7800 Pure hypercholesterolemia, unspecified: Secondary | ICD-10-CM | POA: Diagnosis not present

## 2017-05-14 DIAGNOSIS — Y998 Other external cause status: Secondary | ICD-10-CM | POA: Diagnosis not present

## 2017-05-14 DIAGNOSIS — S199XXA Unspecified injury of neck, initial encounter: Secondary | ICD-10-CM | POA: Diagnosis present

## 2017-05-14 DIAGNOSIS — S161XXA Strain of muscle, fascia and tendon at neck level, initial encounter: Secondary | ICD-10-CM | POA: Insufficient documentation

## 2017-05-14 DIAGNOSIS — S29019A Strain of muscle and tendon of unspecified wall of thorax, initial encounter: Secondary | ICD-10-CM | POA: Diagnosis not present

## 2017-05-14 DIAGNOSIS — Y92410 Unspecified street and highway as the place of occurrence of the external cause: Secondary | ICD-10-CM | POA: Insufficient documentation

## 2017-05-14 DIAGNOSIS — Y939 Activity, unspecified: Secondary | ICD-10-CM | POA: Diagnosis not present

## 2017-05-14 MED ORDER — CYCLOBENZAPRINE HCL 10 MG PO TABS: 10.0000 mg | ORAL_TABLET | Freq: Two times a day (BID) | ORAL | 0 refills | Status: AC | PRN

## 2017-05-14 MED ORDER — FENTANYL CITRATE (PF) 100 MCG/2ML IJ SOLN
50.0000 ug | Freq: Once | INTRAMUSCULAR | Status: AC
  Administered 2017-05-14: 50 ug via INTRAVENOUS
  Filled 2017-05-14: qty 2

## 2017-05-14 MED ORDER — CYCLOBENZAPRINE HCL 10 MG PO TABS
10.0000 mg | ORAL_TABLET | Freq: Once | ORAL | Status: AC
  Administered 2017-05-14: 10 mg via ORAL
  Filled 2017-05-14: qty 1

## 2017-05-14 MED ORDER — ACETAMINOPHEN 500 MG PO TABS
1000.0000 mg | ORAL_TABLET | Freq: Once | ORAL | Status: AC
  Administered 2017-05-14: 1000 mg via ORAL
  Filled 2017-05-14: qty 2

## 2017-05-14 MED ORDER — ONDANSETRON HCL 4 MG/2ML IJ SOLN
4.0000 mg | Freq: Once | INTRAMUSCULAR | Status: AC
  Administered 2017-05-14: 4 mg via INTRAVENOUS
  Filled 2017-05-14: qty 2

## 2017-05-14 MED ORDER — IBUPROFEN 400 MG PO TABS
400.0000 mg | ORAL_TABLET | Freq: Once | ORAL | Status: AC
  Administered 2017-05-14: 400 mg via ORAL
  Filled 2017-05-14: qty 1

## 2017-05-14 NOTE — ED Notes (Signed)
Have applied EKG leads
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
## 2018-06-10 ENCOUNTER — Other Ambulatory Visit: Payer: Self-pay | Admitting: Internal Medicine

## 2018-06-10 DIAGNOSIS — Z1231 Encounter for screening mammogram for malignant neoplasm of breast: Secondary | ICD-10-CM
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
## 2019-06-30 ENCOUNTER — Encounter (INDEPENDENT_AMBULATORY_CARE_PROVIDER_SITE_OTHER): Payer: Self-pay

## 2019-06-30 ENCOUNTER — Other Ambulatory Visit: Payer: Self-pay

## 2019-06-30 ENCOUNTER — Ambulatory Visit (INDEPENDENT_AMBULATORY_CARE_PROVIDER_SITE_OTHER): Payer: Medicare Other | Admitting: Gastroenterology

## 2019-06-30 ENCOUNTER — Encounter: Payer: Self-pay | Admitting: Gastroenterology

## 2019-06-30 VITALS — BP 130/66 | HR 67 | Temp 97.6°F | Resp 17 | Wt 173.4 lb

## 2019-06-30 DIAGNOSIS — K219 Gastro-esophageal reflux disease without esophagitis: Secondary | ICD-10-CM | POA: Diagnosis not present

## 2019-06-30 DIAGNOSIS — Z1211 Encounter for screening for malignant neoplasm of colon: Secondary | ICD-10-CM

## 2019-06-30 NOTE — Progress Notes (Signed)
Heather Darby, MD [facility]  [place], [place] [address]  Main: [phone]  Fax: [phone]    Gastroenterology Consultation  Referring Provider:     Ellamae Sia, MD Primary Care Physician:  Ellamae Sia, MD Primary Gastroenterologist:  Dr. Cephas Carter Reason for Consultation:     Chronic GERD and colon cancer screening        HPI:   Heather Carter is a 63 y.o. female referred by Dr. Quay Burow, Ala Dach, MD  for consultation & management of chronic GERD and colon cancer screening.  Patient reports that she has been experiencing several months of severe burning in her chest, acid taste in her mouth.  She started omeprazole 20 mg once a day as well as on a keto diet.  She reports her reflux symptoms have almost resolved and he is currently taking omeprazole as needed only.  She is also requesting to undergo colonoscopy for screening.  She does not smoke or drink alcohol.  She denies any other GI symptoms.  She does have history of bleeding gastric ulcer several years ago and had black stools as well.  She attributed it to Advil use at that time  NSAIDs: None  Antiplts/Anticoagulants/Anti thrombotics: None  GI Procedures: EGD and colonoscopy several years ago Brother with esophageal cancer at age 68  Past Medical History:  Diagnosis Date  . Arthritis   . Diabetes mellitus without complication ([place])   . High cholesterol   . Hypertension     Past Surgical History:  Procedure Laterality Date  . ABDOMINAL HYSTERECTOMY    . CHOLECYSTECTOMY      Current Outpatient Medications:  .  albuterol (VENTOLIN HFA) 108 (90 Base) MCG/ACT inhaler, Inhale into the lungs., Disp: , Rfl:  .  atenolol (TENORMIN) 25 MG tablet, Take 25 mg by mouth daily., Disp: , Rfl:  .  atorvastatin (LIPITOR) 40 MG tablet, Take 40 mg by mouth daily., Disp: , Rfl:  .  cyclobenzaprine (FLEXERIL) 10 MG tablet, Take 1 tablet (10 mg total) by mouth 2 (two) times daily as needed  for muscle spasms., Disp: 20 tablet, Rfl: 0 .  enalapril (VASOTEC) 2.5 MG tablet, Take by mouth., Disp: , Rfl:  .  GLIPIZIDE XL 10 MG 24 hr tablet, Take 10 mg by mouth daily., Disp: , Rfl:  .  metFORMIN (GLUCOPHAGE-XR) 500 MG 24 hr tablet, Take 1,000 mg by mouth 2 (two) times daily., Disp: , Rfl:  .  omeprazole (PRILOSEC) 20 MG capsule, Take 20 mg by mouth daily., Disp: , Rfl:  .  sertraline (ZOLOFT) 100 MG tablet, Take 100 mg by mouth daily., Disp: , Rfl:  .  TRADJENTA 5 MG TABS tablet, Take 5 mg by mouth daily., Disp: , Rfl:  .  triamterene-hydrochlorothiazide (MAXZIDE) 75-50 MG tablet, Take 1 tablet by mouth daily., Disp: , Rfl:  .  zolpidem (AMBIEN) 5 MG tablet, Take 5 mg by mouth at bedtime as needed., Disp: , Rfl:   No family history on file.   Social History   Tobacco Use  . Smoking status: Never Smoker  . Smokeless tobacco: Never Used  Substance Use Topics  . Alcohol use: No  . Drug use: No    Allergies as of 06/30/2019 - Review Complete 06/30/2019  Allergen Reaction Noted  . Codeine  05/20/2010    Review of Systems:    All systems reviewed and negative except where noted in HPI.   Physical Exam:  BP  130/66 (BP Location: Left Arm, Patient Position: Sitting, Cuff Size: Normal)   Pulse 67   Temp 97.6 F (36.4 C)   Resp 17   Wt 173 lb 6.4 oz (78.7 kg)   BMI 28.86 kg/m  No LMP recorded. Patient has had a hysterectomy.  General:   Alert,  Well-developed, well-nourished, pleasant and cooperative in NAD Head:  Normocephalic and atraumatic. Eyes:  Sclera clear, no icterus.   Conjunctiva pink. Ears:  Normal auditory acuity. Nose:  No deformity, discharge, or lesions. Mouth:  No deformity or lesions,oropharynx pink & moist. Neck:  Supple; no masses or thyromegaly. Lungs:  Respirations even and unlabored.  Clear throughout to auscultation.   No wheezes, crackles, or rhonchi. No acute distress. Heart:  Regular rate and rhythm; no murmurs, clicks, rubs, or gallops.  Abdomen:  Normal bowel sounds. Soft, non-tender and non-distended without masses, hepatosplenomegaly or hernias noted.  No guarding or rebound tenderness.   Rectal: Not performed Msk:  Symmetrical without gross deformities. Good, equal movement & strength bilaterally. Pulses:  Normal pulses noted. Extremities:  No clubbing or edema.  No cyanosis. Neurologic:  Alert and oriented x3;  grossly normal neurologically. Skin:  Intact without significant lesions or rashes. No jaundice. Psych:  Alert and cooperative. Normal mood and affect.  Imaging Studies: No recent abdominal imaging  Assessment and Plan:   [name] is a 63 y.o. female with history of diabetes is seen in consultation for Chronic GERD and to discuss about colonoscopy for colon cancer screen  Chronic GERD, currently asymptomatic Given her family history and chronicity of symptoms recommend upper endoscopy to evaluate for erosive esophagitis and screening for Barrett's Continue antireflux lifestyle  Colon cancer screening Recommend colonoscopy  I have discussed alternative options, risks & benefits,  which include, but are not limited to, bleeding, infection, perforation,respiratory complication & drug reaction.  The patient agrees with this plan & written consent will be obtained.     Follow up as needed   Heather Darby, MD

## 2019-07-15 ENCOUNTER — Other Ambulatory Visit
Admission: RE | Admit: 2019-07-15 | Discharge: 2019-07-15 | Disposition: A | Discharge status: Home / Self Care | Payer: Medicare Other | Source: Ambulatory Visit | Attending: Gastroenterology | Admitting: Gastroenterology

## 2019-07-15 DIAGNOSIS — Z01812 Encounter for preprocedural laboratory examination: Secondary | ICD-10-CM | POA: Insufficient documentation

## 2019-07-15 DIAGNOSIS — Z20828 Contact with and (suspected) exposure to other viral communicable diseases: Secondary | ICD-10-CM | POA: Diagnosis not present

## 2019-07-16 ENCOUNTER — Other Ambulatory Visit: Payer: Self-pay | Admitting: Internal Medicine

## 2019-07-16 DIAGNOSIS — Z1231 Encounter for screening mammogram for malignant neoplasm of breast: Secondary | ICD-10-CM

## 2019-07-16 DIAGNOSIS — Z13820 Encounter for screening for osteoporosis: Secondary | ICD-10-CM

## 2019-07-16 LAB — SARS CORONAVIRUS 2 (TAT 6-24 HRS): SARS Coronavirus 2: NEGATIVE

## 2019-07-18 ENCOUNTER — Ambulatory Visit: Payer: Medicare Other | Admitting: Anesthesiology

## 2019-07-18 ENCOUNTER — Ambulatory Visit
Admission: RE | Admit: 2019-07-18 | Discharge: 2019-07-18 | Disposition: A | Discharge status: Home / Self Care | Payer: Medicare Other | Attending: Gastroenterology | Admitting: Gastroenterology

## 2019-07-18 ENCOUNTER — Encounter
Admission: RE | Disposition: A | Discharge status: Home / Self Care | Payer: Self-pay | Source: Home / Self Care | Attending: Gastroenterology

## 2019-07-18 ENCOUNTER — Other Ambulatory Visit: Payer: Self-pay

## 2019-07-18 ENCOUNTER — Encounter: Payer: Self-pay | Admitting: Certified Registered Nurse Anesthetist

## 2019-07-18 DIAGNOSIS — E119 Type 2 diabetes mellitus without complications: Secondary | ICD-10-CM | POA: Insufficient documentation

## 2019-07-18 DIAGNOSIS — E7800 Pure hypercholesterolemia, unspecified: Secondary | ICD-10-CM | POA: Insufficient documentation

## 2019-07-18 DIAGNOSIS — M1990 Unspecified osteoarthritis, unspecified site: Secondary | ICD-10-CM | POA: Diagnosis not present

## 2019-07-18 DIAGNOSIS — K449 Diaphragmatic hernia without obstruction or gangrene: Secondary | ICD-10-CM | POA: Insufficient documentation

## 2019-07-18 DIAGNOSIS — Z1211 Encounter for screening for malignant neoplasm of colon: Secondary | ICD-10-CM | POA: Diagnosis not present

## 2019-07-18 DIAGNOSIS — K259 Gastric ulcer, unspecified as acute or chronic, without hemorrhage or perforation: Secondary | ICD-10-CM | POA: Diagnosis not present

## 2019-07-18 DIAGNOSIS — K3189 Other diseases of stomach and duodenum: Secondary | ICD-10-CM | POA: Insufficient documentation

## 2019-07-18 DIAGNOSIS — K5730 Diverticulosis of large intestine without perforation or abscess without bleeding: Secondary | ICD-10-CM | POA: Insufficient documentation

## 2019-07-18 DIAGNOSIS — Z13810 Encounter for screening for upper gastrointestinal disorder: Secondary | ICD-10-CM | POA: Diagnosis not present

## 2019-07-18 DIAGNOSIS — K644 Residual hemorrhoidal skin tags: Secondary | ICD-10-CM | POA: Insufficient documentation

## 2019-07-18 DIAGNOSIS — I10 Essential (primary) hypertension: Secondary | ICD-10-CM | POA: Diagnosis not present

## 2019-07-18 DIAGNOSIS — Z79899 Other long term (current) drug therapy: Secondary | ICD-10-CM | POA: Diagnosis not present

## 2019-07-18 DIAGNOSIS — K219 Gastro-esophageal reflux disease without esophagitis: Secondary | ICD-10-CM | POA: Diagnosis not present

## 2019-07-18 DIAGNOSIS — Z7984 Long term (current) use of oral hypoglycemic drugs: Secondary | ICD-10-CM | POA: Insufficient documentation

## 2019-07-18 HISTORY — PX: COLONOSCOPY WITH PROPOFOL: SHX5780

## 2019-07-18 HISTORY — PX: ESOPHAGOGASTRODUODENOSCOPY (EGD) WITH PROPOFOL: SHX5813

## 2019-07-18 LAB — GLUCOSE, CAPILLARY: Glucose-Capillary: 254 mg/dL — ABNORMAL HIGH (ref 70–99)

## 2019-07-18 SURGERY — ESOPHAGOGASTRODUODENOSCOPY (EGD) WITH PROPOFOL
Anesthesia: General

## 2019-07-18 MED ORDER — SODIUM CHLORIDE 0.9 % IV SOLN
INTRAVENOUS | Status: DC
  Administered 2019-07-18: 1000 mL via INTRAVENOUS

## 2019-07-18 MED ORDER — PROPOFOL 10 MG/ML IV BOLUS
INTRAVENOUS | Status: DC | PRN
  Administered 2019-07-18: 40 mg via INTRAVENOUS
  Administered 2019-07-18: 60 mg via INTRAVENOUS

## 2019-07-18 MED ORDER — LIDOCAINE HCL (CARDIAC) PF 100 MG/5ML IV SOSY
PREFILLED_SYRINGE | INTRAVENOUS | Status: DC | PRN
  Administered 2019-07-18: 50 mg via INTRAVENOUS

## 2019-07-18 MED ORDER — PROPOFOL 500 MG/50ML IV EMUL
INTRAVENOUS | Status: AC
  Filled 2019-07-18: qty 50

## 2019-07-18 MED ORDER — GLUCAGON HCL RDNA (DIAGNOSTIC) 1 MG IJ SOLR
INTRAMUSCULAR | Status: AC
  Filled 2019-07-18: qty 1

## 2019-07-18 MED ORDER — PROPOFOL 500 MG/50ML IV EMUL
INTRAVENOUS | Status: DC | PRN
  Administered 2019-07-18: 175 ug/kg/min via INTRAVENOUS

## 2019-07-18 MED ORDER — DEXAMETHASONE SODIUM PHOSPHATE 10 MG/ML IJ SOLN: INTRAMUSCULAR | Status: DC | PRN

## 2019-07-18 MED ORDER — PHENYLEPHRINE HCL (PRESSORS) 10 MG/ML IV SOLN
INTRAVENOUS | Status: DC | PRN
  Administered 2019-07-18: 100 ug via INTRAVENOUS

## 2019-07-18 NOTE — Op Note (Addendum)
[facility] Patient Name: Heather Carter Procedure Date: 07/18/2019 11:15 AM MRN: [id number] Account #: [id number] Date of Birth: Feb 28, 1956 Admit Type: Outpatient Age: 63 Room: [facility] ENDO ROOM 1 Gender: Female Note Status: Finalized Procedure:             Upper GI endoscopy Indications:           Screening for Barrett's esophagus, Screening for                         Barrett's esophagus in patient at risk for this                         condition, Suspected gastro-esophageal reflux disease Providers:             Lin Landsman MD, MD Referring MD:          Remus Blake MD, MD (Referring MD) Medicines:             Monitored Anesthesia Care Complications:         No immediate complications. Estimated blood loss: None. Procedure:             Pre-Anesthesia Assessment:                        - Prior to the procedure, a History and Physical was                         performed, and patient medications and allergies were                         reviewed. The patient is competent. The risks and                         benefits of the procedure and the sedation options and                         risks were discussed with the patient. All questions                         were answered and informed consent was obtained.                         Patient identification and proposed procedure were                         verified by the physician, the nurse, the                         anesthesiologist, the anesthetist and the technician                         in the pre-procedure area in the procedure room in the                         endoscopy suite. Mental Status Examination: alert and                         oriented. Airway Examination: normal oropharyngeal  airway and neck mobility. Respiratory Examination:                         clear to auscultation. CV Examination: normal.                         Prophylactic  Antibiotics: The patient does not require                         prophylactic antibiotics. Prior Anticoagulants: The                         patient has taken no previous anticoagulant or                         antiplatelet agents. ASA Grade Assessment: III - A                         patient with severe systemic disease. After reviewing                         the risks and benefits, the patient was deemed in                         satisfactory condition to undergo the procedure. The                         anesthesia plan was to use monitored anesthesia care                         (MAC). Immediately prior to administration of                         medications, the patient was re-assessed for adequacy                         to receive sedatives. The heart rate, respiratory                         rate, oxygen saturations, blood pressure, adequacy of                         pulmonary ventilation, and response to care were                         monitored throughout the procedure. The physical                         status of the patient was re-assessed after the                         procedure.                        After obtaining informed consent, the endoscope was                         passed under direct vision. Throughout the procedure,  the patient's blood pressure, pulse, and oxygen                         saturations were monitored continuously. The Endoscope                         was introduced through the mouth, and advanced to the                         second part of duodenum. The upper GI endoscopy was                         accomplished without difficulty. The patient tolerated                         the procedure well. Findings:      The duodenal bulb and second portion of the duodenum were normal.      A 4 cm hiatal hernia with multiple Cameron erosions, non bleeding were       found. The proximal extent of the gastric folds (end of  tubular       esophagus) was 39 cm from the incisors. The hiatal narrowing was 35 cm       from the incisors. The Z-line was 39 cm from the incisors.      Multiple dispersed small erosions with no bleeding and no stigmata of       recent bleeding were found in the gastric body and in the gastric       antrum. Biopsies were taken with a cold forceps for Helicobacter pylori       testing.      The gastroesophageal junction and examined esophagus were normal.      Esophagogastric landmarks were identified: the gastroesophageal junction       was found at 39 cm from the incisors. Impression:            - Normal duodenal bulb and second portion of the                         duodenum.                        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                        - Erosive gastropathy with no bleeding and no stigmata                         of recent bleeding. Biopsied.                        - Normal gastroesophageal junction and esophagus.                        - Esophagogastric landmarks identified. Recommendation:        - Await pathology results.                        - Follow an antireflux regimen.                        -  Use Prilosec (omeprazole) 40 mg PO BID for 1 month.                        - Return to my office as previously scheduled.                        - Proceed with colonoscopy as scheduled                        See colonoscopy report Procedure Code(s):     --- Professional ---                        [phone], Esophagogastroduodenoscopy, flexible,                         transoral; with biopsy, single or multiple Diagnosis Code(s):     --- Professional ---                        K44.9, Diaphragmatic hernia without obstruction or                         gangrene                        K25.9, Gastric ulcer, unspecified as acute or chronic,                         without hemorrhage or perforation                        K31.89, Other diseases of stomach and  duodenum                        Z13.810, Encounter for screening for upper                         gastrointestinal disorder CPT copyright 2019 American Medical Association. All rights reserved. The codes documented in this report are preliminary and upon coder review may  be revised to meet current compliance requirements. Dr. Ulyess Mort Lin Landsman MD, MD 07/18/2019 11:37:02 AM This report has been signed electronically. Number of Addenda: 0 Note Initiated On: 07/18/2019 11:15 AM Estimated Blood Loss:  Estimated blood loss: none.      [facility]

## 2019-07-18 NOTE — Anesthesia Postprocedure Evaluation (Signed)
Anesthesia Post Note  Patient: EATHEL SPAHN  Procedure(s) Performed: ESOPHAGOGASTRODUODENOSCOPY (EGD) WITH PROPOFOL (N/A ) COLONOSCOPY WITH PROPOFOL (N/A )  Patient location during evaluation: Endoscopy Anesthesia Type: General Level of consciousness: awake and alert Pain management: pain level controlled Vital Signs Assessment: post-procedure vital signs reviewed and stable Respiratory status: spontaneous breathing, nonlabored ventilation, respiratory function stable and patient connected to nasal cannula oxygen Cardiovascular status: blood pressure returned to baseline and stable Postop Assessment: no apparent nausea or vomiting Anesthetic complications: no     Last Vitals:  Vitals:   07/18/19 1218 07/18/19 1228  BP: 121/68 (!) 108/54  Pulse: 62 (!) 54  Resp: 16 16  Temp:    SpO2: 99% 100%    Last Pain:  Vitals:   07/18/19 1228  TempSrc:   PainSc: 0-No pain                 Precious Haws [place]

## 2019-07-18 NOTE — Anesthesia Post-op Follow-up Note (Signed)
Anesthesia QCDR form completed.        

## 2019-07-18 NOTE — Transfer of Care (Signed)
Immediate Anesthesia Transfer of Care Note  Patient: Heather Carter  Procedure(s) Performed: ESOPHAGOGASTRODUODENOSCOPY (EGD) WITH PROPOFOL (N/A ) COLONOSCOPY WITH PROPOFOL (N/A )  Patient Location: PACU  Anesthesia Type:General  Level of Consciousness: awake and alert   Airway & Oxygen Therapy: Patient Spontanous Breathing and Patient connected to nasal cannula oxygen  Post-op Assessment: Report given to RN and Post -op Vital signs reviewed and stable  Post vital signs: Reviewed and stable  Last Vitals:  Vitals Value Taken Time  BP 92/47 07/18/19 1209  Temp    Pulse 66 07/18/19 1209  Resp 18 07/18/19 1209  SpO2 99 % 07/18/19 1209    Last Pain:  Vitals:   07/18/19 1037  TempSrc: Tympanic  PainSc: 0-No pain         Complications: No apparent anesthesia complications

## 2019-07-18 NOTE — H&P (Signed)
Cephas Darby, MD [facility]  [place], [place] [address]  Main: [phone]  Fax: [phone] Pager: [phone]  Primary Care Physician:  Ellamae Sia, MD Primary Gastroenterologist:  Dr. Cephas Darby  Pre-Procedure History & Physical: HPI:  Heather Carter is a 63 y.o. female is here for an endoscopy and colonoscopy.   Past Medical History:  Diagnosis Date  . Arthritis   . Diabetes mellitus without complication ([place])   . High cholesterol   . Hypertension     Past Surgical History:  Procedure Laterality Date  . ABDOMINAL HYSTERECTOMY    . CHOLECYSTECTOMY      Prior to Admission medications   Medication Sig Start Date End Date Taking? Authorizing Provider  albuterol (VENTOLIN HFA) 108 (90 Base) MCG/ACT inhaler Inhale into the lungs.   Yes [provider]  atenolol (TENORMIN) 25 MG tablet Take 25 mg by mouth daily. 05/21/19  Yes [provider]  atorvastatin (LIPITOR) 40 MG tablet Take 40 mg by mouth daily. 01/24/19  Yes [provider]  cyclobenzaprine (FLEXERIL) 10 MG tablet Take 1 tablet (10 mg total) by mouth 2 (two) times daily as needed for muscle spasms. 05/14/17  Yes Plunkett, Loree Fee, MD  enalapril (VASOTEC) 2.5 MG tablet Take by mouth.   Yes [provider]  GLIPIZIDE XL 10 MG 24 hr tablet Take 10 mg by mouth daily. 05/19/19  Yes [provider]  metFORMIN (GLUCOPHAGE-XR) 500 MG 24 hr tablet Take 1,000 mg by mouth 2 (two) times daily. 05/19/19  Yes [provider]  omeprazole (PRILOSEC) 20 MG capsule Take 20 mg by mouth daily. 05/21/19  Yes [provider]  sertraline (ZOLOFT) 100 MG tablet Take 100 mg by mouth daily. 05/15/19  Yes [provider]  TRADJENTA 5 MG TABS tablet Take 5 mg by mouth daily. 05/13/19  Yes [provider]  triamterene-hydrochlorothiazide (MAXZIDE) 75-50 MG tablet Take 1 tablet by mouth daily. 05/21/19  Yes [provider]  zolpidem  (AMBIEN) 5 MG tablet Take 5 mg by mouth at bedtime as needed. 06/21/19  Yes [provider]    Allergies as of 06/30/2019 - Review Complete 06/30/2019  Allergen Reaction Noted  . Codeine  05/20/2010    History reviewed. No pertinent family history.  Social History   Socioeconomic History  . Marital status: Married    Spouse name: Not on file  . Number of children: Not on file  . Years of education: Not on file  . Highest education level: Not on file  Occupational History  . Not on file  Social Needs  . Financial resource strain: Not on file  . Food insecurity    Worry: Not on file    Inability: Not on file  . Transportation needs    Medical: Not on file    Non-medical: Not on file  Tobacco Use  . Smoking status: Never Smoker  . Smokeless tobacco: Never Used  Substance and Sexual Activity  . Alcohol use: No  . Drug use: No  . Sexual activity: Not on file  Lifestyle  . Physical activity    Days per week: Not on file    Minutes per session: Not on file  . Stress: Not on file  Relationships  . Social Herbalist on phone: Not on file    Gets together: Not on file    Attends religious service: Not on file    Active member of club or  organization: Not on file    Attends meetings of clubs or organizations: Not on file    Relationship status: Not on file  . Intimate partner violence    Fear of current or ex partner: Not on file    Emotionally abused: Not on file    Physically abused: Not on file    Forced sexual activity: Not on file  Other Topics Concern  . Not on file  Social History Narrative  . Not on file    Review of Systems: See HPI, otherwise negative ROS  Physical Exam: BP 124/79   Pulse 61   Temp 98.2 F (36.8 C) (Tympanic)   Resp 20   Ht 5\' 6"  (1.676 m)   Wt 76.2 kg   SpO2 100%   BMI 27.12 kg/m  General:   Alert,  pleasant and cooperative in NAD Head:  Normocephalic and atraumatic. Neck:  Supple; no masses or thyromegaly.  Lungs:  Clear throughout to auscultation.    Heart:  Regular rate and rhythm. Abdomen:  Soft, nontender and nondistended. Normal bowel sounds, without guarding, and without rebound.   Neurologic:  Alert and  oriented x4;  grossly normal neurologically.  Impression/Plan: Heather Carter is here for an endoscopy and colonoscopy to be performed for chronic GERD, colon cancer screening  Risks, benefits, limitations, and alternatives regarding  endoscopy and colonoscopy have been reviewed with the patient.  Questions have been answered.  All parties agreeable.   Sherri Sear, MD  07/18/2019, 11:10 AM

## 2019-07-18 NOTE — Anesthesia Preprocedure Evaluation (Signed)
Anesthesia Evaluation  Patient identified by MRN, date of birth, ID band Patient awake    Reviewed: Allergy & Precautions, H&P , NPO status , Patient's Chart, lab work & pertinent test results  History of Anesthesia Complications Negative for: history of anesthetic complications  Airway Mallampati: III  TM Distance: >3 FB Neck ROM: limited    Dental  (+) Chipped   Pulmonary neg pulmonary ROS, neg shortness of breath,           Cardiovascular Exercise Tolerance: Good hypertension, (-) angina(-) Past MI      Neuro/Psych PSYCHIATRIC DISORDERS  Neuromuscular disease negative psych ROS   GI/Hepatic negative GI ROS, Neg liver ROS,   Endo/Other  diabetes, Type 2  Renal/GU negative Renal ROS  negative genitourinary   Musculoskeletal  (+) Arthritis ,   Abdominal   Peds  Hematology negative hematology ROS (+)   Anesthesia Other Findings Past Medical History: No date: Arthritis No date: Diabetes mellitus without complication (HCC) No date: High cholesterol No date: Hypertension  Past Surgical History: No date: ABDOMINAL HYSTERECTOMY No date: CHOLECYSTECTOMY  BMI    Body Mass Index: 27.12 kg/m      Reproductive/Obstetrics negative OB ROS                             Anesthesia Physical Anesthesia Plan  ASA: III  Anesthesia Plan: General   Post-op Pain Management:    Induction: Intravenous  PONV Risk Score and Plan: Propofol infusion and TIVA  Airway Management Planned: Natural Airway and Nasal Cannula  Additional Equipment:   Intra-op Plan:   Post-operative Plan:   Informed Consent: I have reviewed the patients History and Physical, chart, labs and discussed the procedure including the risks, benefits and alternatives for the proposed anesthesia with the patient or authorized representative who has indicated his/her understanding and acceptance.     Dental Advisory  Given  Plan Discussed with: Anesthesiologist, CRNA and Surgeon  Anesthesia Plan Comments: (Patient consented for risks of anesthesia including but not limited to:  - adverse reactions to medications - risk of intubation if required - damage to teeth, lips or other oral mucosa - sore throat or hoarseness - Damage to heart, brain, lungs or loss of life  Patient voiced understanding.)        Anesthesia Quick Evaluation

## 2019-07-18 NOTE — Op Note (Signed)
[facility] Gastroenterology Patient Name: Ndidi Nesby Procedure Date: 07/18/2019 11:14 AM MRN: [id number] Account #: [id number] Date of Birth: 1956/03/15 Admit Type: Outpatient Age: 63 Room: [facility] ENDO ROOM 1 Gender: Female Note Status: Finalized Procedure:             Colonoscopy Indications:           Screening for colorectal malignant neoplasm, This is                         the patient's first colonoscopy Providers:             Lin Landsman MD, MD Referring MD:          Remus Blake MD, MD (Referring MD) Medicines:             Monitored Anesthesia Care Complications:         No immediate complications. Estimated blood loss: None. Procedure:             Pre-Anesthesia Assessment:                        - Prior to the procedure, a History and Physical was                         performed, and patient medications and allergies were                         reviewed. The patient is competent. The risks and                         benefits of the procedure and the sedation options and                         risks were discussed with the patient. All questions                         were answered and informed consent was obtained.                         Patient identification and proposed procedure were                         verified by the physician, the nurse, the                         anesthesiologist, the anesthetist and the technician                         in the pre-procedure area in the procedure room in the                         endoscopy suite. Mental Status Examination: alert and                         oriented. Airway Examination: normal oropharyngeal                         airway and neck mobility. Respiratory Examination:  clear to auscultation. CV Examination: normal.                         Prophylactic Antibiotics: The patient does not require                         prophylactic antibiotics. Prior  Anticoagulants: The                         patient has taken no previous anticoagulant or                         antiplatelet agents. ASA Grade Assessment: III - A                         patient with severe systemic disease. After reviewing                         the risks and benefits, the patient was deemed in                         satisfactory condition to undergo the procedure. The                         anesthesia plan was to use monitored anesthesia care                         (MAC). Immediately prior to administration of                         medications, the patient was re-assessed for adequacy                         to receive sedatives. The heart rate, respiratory                         rate, oxygen saturations, blood pressure, adequacy of                         pulmonary ventilation, and response to care were                         monitored throughout the procedure. The physical                         status of the patient was re-assessed after the                         procedure.                        After obtaining informed consent, the colonoscope was                         passed under direct vision. Throughout the procedure,                         the patient's blood pressure, pulse, and oxygen  saturations were monitored continuously. The                         Colonoscope was introduced through the anus and                         advanced to the the cecum, identified by appendiceal                         orifice and ileocecal valve. The colonoscopy was                         performed with moderate difficulty due to significant                         looping. Successful completion of the procedure was                         aided by applying abdominal pressure. The patient                         tolerated the procedure well. The quality of the bowel                         preparation was evaluated using the BBPS [employer] [facility]  Bowel                         Preparation Scale) with scores of: Right Colon = 3,                         Transverse Colon = 3 and Left Colon = 3 (entire mucosa                         seen well with no residual staining, small fragments                         of stool or opaque liquid). The total BBPS score                         equals 9. Findings:      The perianal and digital rectal examinations were normal. Pertinent       negatives include normal sphincter tone and no palpable rectal lesions.      A 7 mm polyp was found in the appendiceal orifice. The polyp was flat       and sessile. Polypectomy was not attempted due to inadequate lifting and       location of the polyp within appendiceal orifice.      Non-bleeding external hemorrhoids were found during retroflexion. The       hemorrhoids were medium-sized.      A few diverticula were found in the left colon and right colon.      The exam was otherwise without abnormality. Impression:            - One 7 mm polyp at the appendiceal orifice. Resection                         not attempted.                        -  Non-bleeding external hemorrhoids.                        - Diverticulosis in the left colon and in the right                         colon.                        - The examination was otherwise normal.                        - No specimens collected. Recommendation:        - Discharge patient to home (with escort).                        - Resume previous diet today.                        - Continue present medications.                        - Refer to a surgeon as previously scheduled.                        - Repeat colonoscopy in 7 years for surveillance.                        - Return to my office as previously scheduled. Procedure Code(s):     --- Professional ---                        Z3664, Colorectal cancer screening; colonoscopy on                         individual not meeting criteria for high  risk Diagnosis Code(s):     --- Professional ---                        Z12.11, Encounter for screening for malignant neoplasm                         of colon                        K63.5, Polyp of colon                        K64.4, Residual hemorrhoidal skin tags                        K57.30, Diverticulosis of large intestine without                         perforation or abscess without bleeding CPT copyright 2019 American Medical Association. All rights reserved. The codes documented in this report are preliminary and upon coder review may  be revised to meet current compliance requirements. Dr. Ulyess Mort Lin Landsman MD, MD 07/18/2019 12:04:26 PM This report has been signed electronically. Number of Addenda: 0 Note Initiated On: 07/18/2019 11:14 AM Scope Withdrawal Time: 0 hours 18 minutes 28 seconds  Total Procedure Duration: 0 hours  22 minutes 18 seconds  Estimated Blood Loss:  Estimated blood loss: none.      [facility]

## 2019-07-18 NOTE — Anesthesia Procedure Notes (Signed)
Date/Time: 07/18/2019 11:20 AM Performed by: Johnna Acosta, CRNA Pre-anesthesia Checklist: Patient identified, Emergency Drugs available, Suction available, Patient being monitored and Timeout performed Oxygen Delivery Method: Nasal cannula Preoxygenation: Pre-oxygenation with 100% oxygen Induction Type: IV induction

## 2019-07-21 ENCOUNTER — Encounter: Payer: Self-pay | Admitting: Gastroenterology

## 2019-07-21 ENCOUNTER — Telehealth: Payer: Self-pay

## 2019-07-21 DIAGNOSIS — K635 Polyp of colon: Secondary | ICD-10-CM

## 2019-07-21 LAB — SURGICAL PATHOLOGY

## 2019-07-21 NOTE — Telephone Encounter (Signed)
Did referral to general surgery

## 2019-07-21 NOTE — Telephone Encounter (Signed)
-----   Message from Lin Landsman, MD sent at 07/21/2019 10:35 AM EST ----- Regarding: Surgery referral Please refer her to general surgery Dx: Unresectable Polyp in the appendiceal orifice, recommend cecectomy Document in herr chart  Thanks RV

## 2019-08-01 ENCOUNTER — Other Ambulatory Visit: Payer: Self-pay

## 2019-08-01 ENCOUNTER — Ambulatory Visit (INDEPENDENT_AMBULATORY_CARE_PROVIDER_SITE_OTHER): Payer: Medicare Other | Admitting: Surgery

## 2019-08-01 ENCOUNTER — Encounter: Payer: Self-pay | Admitting: Surgery

## 2019-08-01 VITALS — BP 149/82 | HR 83 | Temp 97.0°F | Resp 12 | Ht 65.0 in | Wt 173.0 lb

## 2019-08-01 DIAGNOSIS — K635 Polyp of colon: Secondary | ICD-10-CM | POA: Diagnosis not present

## 2019-08-01 NOTE — Patient Instructions (Addendum)
Our Surgery Scheduler will contact you within 24-48 hours to schedule your surgery.  Please have your Blue Sheet available when she calls you.    Laparoscopic Appendectomy, Adult  A laparoscopic appendectomy is a surgery to take out the appendix. The appendix is a finger-like structure that is attached to the large intestine. In this surgery, the appendix is removed through three small incisions with the help of a thin, lighted tube that has a camera (laparoscope). This procedure may be done to prevent an inflamed appendix from bursting (rupturing). It may also be done to treat the infection from an appendix that has already ruptured. It is usually done right after inflammation of the appendix (appendicitis) is diagnosed. This is a minimally invasive surgery. It usually results in less pain, fewer problems, and a quicker recovery than surgery done through a large incision. Tell a health care provider about:  Any allergies you have.  All medicines you are taking, including vitamins, herbs, eye drops, creams, and over-the-counter medicines.  Use of steroids (by mouth or creams).  Any problems you or family members have had with anesthetic medicines.  Any blood disorders you have.  Any surgeries you have had.  Any medical conditions you have.  Whether you are pregnant or may be pregnant. What are the risks? Generally, this is a safe procedure. However, problems may occur, including:  Infection.  Bleeding.  Allergic reactions to medicines.  Damage to other structures or organs.  The formation of pus (abscesses).  Long-lasting pain or scarring at the incision sites or inside the abdomen.  Blood clots in the legs. What happens before the procedure? Eating and drinking restrictions Follow instructions from your health care provider about eating and drinking restrictions. You may be asked not to eat or drink as soon as the diagnosis of appendicitis is made. Medicines Ask your  health care provider about:  Changing or stopping your regular medicines. This is especially important if you are taking diabetes medicines or blood thinners.  Taking medicines such as aspirin and ibuprofen. These medicines can thin your blood. Do not take these medicines unless your health care provider tells you to take them.  Taking over-the-counter medicines, vitamins, herbs, and supplements. General instructions  Plan to have someone take you home from the hospital.  If you will be going home right after the procedure, plan to have someone with you for 24 hours.  You may be given antibiotic medicine to help prevent infection or to treat existing inflammation or infection.  Ask your health care provider how your surgical site will be marked or identified.  Ask your health care provider what steps will be taken to help prevent infection. These may include: ? Removing hair at the surgery site. ? Washing skin with a germ-killing soap. ? Taking antibiotic medicine. What happens during the procedure?   An IV will be inserted into one of your veins.  You will be given one or more of the following: ? A medicine to help you relax (sedative). ? A medicine to numb the area (local anesthetic). ? A medicine to make you fall asleep (general anesthetic).  A thin, flexible tube (catheter) may be put into your bladder to drain urine.  A tube may be passed through your nose and into your stomach (NG tube, or nasogastric tube) to drain any stomach contents.  Your surgeon will make three small incisions near your belly button (navel).  Air-like gas will be used to fill your abdomen. The gas will  make your abdomen expand. This helps the surgeon see clearly and gives him or her more room to work.  A laparoscope will be passed through one of the incisions.  Other long, thin surgical instruments will be passed through the other incisions.  The appendix will be located and removed through one  of the incisions.  The abdomen may be washed out to remove bacteria.  The incisions will be closed with stitches (sutures), staples, or adhesive strips.  A bandage (dressing) may be used to cover the incisions.  If a tube was inserted into your bladder or stomach, it will be removed. The procedure may vary among health care providers and hospitals. What happens after the procedure?  Your blood pressure, heart rate, breathing rate, and blood oxygen level will be monitored until you leave the hospital.  You will be given medicines as needed to control pain.  Do not drive for 24 hours if you were given a sedative during your procedure.  If your appendix did not rupture, you may be able to go home the same day after your surgery.  If your appendix ruptured: ? You will get antibiotic medicine through an IV line. ? You may be sent home with a temporary drain. Summary  A laparoscopic appendectomy is a surgery to take out the appendix. The appendix is removed through three small incisions with the help of a thin, lighted tube that has a camera.  This is a safe procedure, but there are some risks, including bleeding, infection, allergic reaction to medicines, or damage to other organs.  You may be asked not to eat or drink as soon as a diagnosis of appendicitis is made.  After the procedure, your blood pressure, heart rate, breathing rate, and blood oxygen level will be monitored until you leave the hospital. This information is not intended to replace advice given to you by your health care provider. Make sure you discuss any questions you have with your health care provider. Document Released: 03/14/2004 Document Revised: 01/31/2018 Document Reviewed: 01/31/2018 Elsevier Patient Education  [employer].  Colon Polyps  Polyps are tissue growths inside the body. Polyps can grow in many places, including the large intestine (colon). A polyp may be a round bump or a mushroom-shaped  growth. You could have one polyp or several. Most colon polyps are noncancerous (benign). However, some colon polyps can become cancerous over time. Finding and removing the polyps early can help prevent this. What are the causes? The exact cause of colon polyps is not known. What increases the risk? You are more likely to develop this condition if you:  Have a family history of colon cancer or colon polyps.  Are older than 23 or older than 45 if you are African American.  Have inflammatory bowel disease, such as ulcerative colitis or Crohn's disease.  Have certain hereditary conditions, such as: ? Familial adenomatous polyposis. ? Lynch syndrome. ? Turcot syndrome. ? Peutz-Jeghers syndrome.  Are overweight.  Smoke cigarettes.  Do not get enough exercise.  Drink too much alcohol.  Eat a diet that is high in fat and red meat and low in fiber.  Had childhood cancer that was treated with abdominal radiation. What are the signs or symptoms? Most polyps do not cause symptoms. If you have symptoms, they may include:  Blood coming from your rectum when having a bowel movement.  Blood in your stool. The stool may look dark red or black.  Abdominal pain.  A change in bowel  habits, such as constipation or diarrhea. How is this diagnosed? This condition is diagnosed with a colonoscopy. This is a procedure in which a lighted, flexible scope is inserted into the anus and then passed into the colon to examine the area. Polyps are sometimes found when a colonoscopy is done as part of routine cancer screening tests. How is this treated? Treatment for this condition involves removing any polyps that are found. Most polyps can be removed during a colonoscopy. Those polyps will then be tested for cancer. Additional treatment may be needed depending on the results of testing. Follow these instructions at home: Lifestyle  Maintain a healthy weight, or lose weight if recommended by your  health care provider.  Exercise every day or as told by your health care provider.  Do not use any products that contain nicotine or tobacco, such as cigarettes and e-cigarettes. If you need help quitting, ask your health care provider.  If you drink alcohol, limit how much you have: ? 0-1 drink a day for women. ? 0-2 drinks a day for men.  Be aware of how much alcohol is in your drink. In the [place] one drink equals one 12 oz bottle of beer (355 mL), one 5 oz glass of wine (148 mL), or one 1 oz shot of hard liquor (44 mL). Eating and drinking   Eat foods that are high in fiber, such as fruits, vegetables, and whole grains.  Eat foods that are high in calcium and vitamin D, such as milk, cheese, yogurt, eggs, liver, fish, and broccoli.  Limit foods that are high in fat, such as fried foods and desserts.  Limit the amount of red meat and processed meat you eat, such as hot dogs, sausage, bacon, and lunch meats. General instructions  Keep all follow-up visits as told by your health care provider. This is important. ? This includes having regularly scheduled colonoscopies. ? Talk to your health care provider about when you need a colonoscopy. Contact a health care provider if:  You have new or worsening bleeding during a bowel movement.  You have new or increased blood in your stool.  You have a change in bowel habits.  You lose weight for no known reason. Summary  Polyps are tissue growths inside the body. Polyps can grow in many places, including the colon.  Most colon polyps are noncancerous (benign), but some can become cancerous over time.  This condition is diagnosed with a colonoscopy.  Treatment for this condition involves removing any polyps that are found. Most polyps can be removed during a colonoscopy. This information is not intended to replace advice given to you by your health care provider. Make sure you discuss any questions you have with your health care  provider. Document Released: 04/26/2004 Document Revised: 11/15/2017 Document Reviewed: 11/15/2017 Elsevier Patient Education  2020 [employer].

## 2019-08-01 NOTE — Progress Notes (Signed)
08/01/2019  Reason for Visit: Appendiceal orifice polyp  Referring Provider: Sherri Sear, MD  History of Present Illness: Heather Carter is a 63 y.o. female presenting for evaluation of an appendiceal orifice polyp.  Patient had a screening colonoscopy on 07/18/2019.  This has been her first colonoscopy.  Patient reports that her brother has a history of colon cancer.  She denies any blood in her stool, changes in bowel habits or constipation or diarrhea or abdominal pain nausea vomiting.  Denies any weight loss, chest pain, shortness of breath.  On colonoscopy, she was noted to have 7 mm polyp at the appendiceal orifice.  However based on the location it was not able to be retrieved or resected.  Thus she was referred to surgery for excision.  Past Medical History: Past Medical History:  Diagnosis Date  . Arthritis   . Diabetes mellitus without complication ([place])   . High cholesterol   . Hypertension      Past Surgical History: Past Surgical History:  Procedure Laterality Date  . ABDOMINAL HYSTERECTOMY    . CHOLECYSTECTOMY    . COLONOSCOPY WITH PROPOFOL N/A 07/18/2019   Procedure: COLONOSCOPY WITH PROPOFOL;  Surgeon: Lin Landsman, MD;  Location: [facility] ENDOSCOPY;  Service: Gastroenterology;  Laterality: N/A;  . ESOPHAGOGASTRODUODENOSCOPY (EGD) WITH PROPOFOL N/A 07/18/2019   Procedure: ESOPHAGOGASTRODUODENOSCOPY (EGD) WITH PROPOFOL;  Surgeon: Lin Landsman, MD;  Location: [facility] ENDOSCOPY;  Service: Gastroenterology;  Laterality: N/A;    Home Medications: Prior to Admission medications   Medication Sig Start Date End Date Taking? Authorizing Provider  albuterol (VENTOLIN HFA) 108 (90 Base) MCG/ACT inhaler Inhale into the lungs.   Yes [provider]  atenolol (TENORMIN) 25 MG tablet Take 25 mg by mouth daily. 05/21/19  Yes [provider]  atorvastatin (LIPITOR) 40 MG tablet Take 40 mg by mouth daily. 01/24/19  Yes [provider]   cyclobenzaprine (FLEXERIL) 10 MG tablet Take 1 tablet (10 mg total) by mouth 2 (two) times daily as needed for muscle spasms. 05/14/17  Yes Plunkett, Loree Fee, MD  enalapril (VASOTEC) 2.5 MG tablet Take by mouth.   Yes [provider]  GLIPIZIDE XL 10 MG 24 hr tablet Take 10 mg by mouth daily. 05/19/19  Yes [provider]  metFORMIN (GLUCOPHAGE-XR) 500 MG 24 hr tablet Take 1,000 mg by mouth 2 (two) times daily. 05/19/19  Yes [provider]  omeprazole (PRILOSEC) 20 MG capsule Take 20 mg by mouth daily. 05/21/19  Yes [provider]  sertraline (ZOLOFT) 100 MG tablet Take 100 mg by mouth daily. 05/15/19  Yes [provider]  TRADJENTA 5 MG TABS tablet Take 5 mg by mouth daily. 05/13/19  Yes [provider]  triamterene-hydrochlorothiazide (MAXZIDE) 75-50 MG tablet Take 1 tablet by mouth daily. 05/21/19  Yes [provider]  zolpidem (AMBIEN) 5 MG tablet Take 5 mg by mouth at bedtime as needed. 06/21/19  Yes [provider]    Allergies: Allergies  Allergen Reactions  . Codeine     Social History:  reports that she has never smoked. She has never used smokeless tobacco. She reports that she does not drink alcohol or use drugs.   Family History: Family History  Problem Relation Age of Onset  . Heart disease Mother   . Heart disease Father     Review of Systems: Review of Systems  Constitutional: Negative for chills and fever.  HENT: Negative for hearing loss.   Respiratory: Negative for shortness of breath.  Cardiovascular: Negative for chest pain.  Gastrointestinal: Negative for abdominal pain, blood in stool, constipation, diarrhea, nausea and vomiting.  Genitourinary: Negative for dysuria.  Musculoskeletal: Negative for myalgias.  Skin: Negative for rash.  Neurological: Negative for dizziness.  Psychiatric/Behavioral: Negative for depression.    Physical Exam BP (!) 149/82   Pulse 83   Temp (!) 97 F (36.1  C)   Resp 12   Ht 5\' 5"  (1.651 m)   Wt 78.5 kg   SpO2 98%   BMI 28.79 kg/m  CONSTITUTIONAL: No acute distress HEENT:  Normocephalic, atraumatic, extraocular motion intact. NECK: Trachea is midline, and there is no jugular venous distension.  RESPIRATORY:  Lungs are clear, and breath sounds are equal bilaterally. Normal respiratory effort without pathologic use of accessory muscles. CARDIOVASCULAR: Heart is regular without murmurs, gallops, or rubs. GI: The abdomen is soft, nondistended, nontender to palpation patient has well-healed scars from a laparoscopic cholecystectomy as well as an open hysterectomy.  MUSCULOSKELETAL:  Normal muscle strength and tone in all four extremities.  No peripheral edema or cyanosis. SKIN: Skin turgor is normal. There are no pathologic skin lesions.  NEUROLOGIC:  Motor and sensation is grossly normal.  Cranial nerves are grossly intact. PSYCH:  Alert and oriented to person, place and time. Affect is normal.  Laboratory Analysis: No results found for this or any previous visit (from the past 24 hour(s)).  Imaging: Colonoscopy on 07/18/2019 - One 7 mm polyp at the appendiceal orifice. Resection not attempted. - Non-bleeding external hemorrhoids. - Diverticulosis in the left colon and in the right colon. - The examination was otherwise normal. - No specimens collected.  Assessment and Plan: This is a 63 y.o. female with newly diagnosed 7 mm polyp at the appendiceal orifice.  -Discussed with the patient based on the location, we can proceed with a laparoscopic appendectomy.  Discussed with her the role of laparoscopy and discussed the surgery at length.  Discussed with her the risks of bleeding, infection, injury to surrounding structures.  She is willing to proceed.  Discussed with her that during the surgery we will send the specimen immediately to pathology for assessment of margins that subsequently will be analyzed to check on what this polyp really is.   Discussed with her that this will be an outpatient procedure.  No bowel prep will be needed for this. -Based on timing for her, she will would like to schedule this case for early January.  We will schedule her for 08/19/2019.  Understands that she will need to get tested again for Covid prior to surgery.  Face-to-face time spent with the patient and care providers was 60 minutes, with more than 50% of the time spent counseling, educating, and coordinating care of the patient.     Melvyn Neth, [facility]

## 2019-08-01 NOTE — H&P (View-Only) (Signed)
08/01/2019  Reason for Visit: Appendiceal orifice polyp  Referring Provider: Sherri Sear, MD  History of Present Illness: Heather Carter is a 63 y.o. female presenting for evaluation of an appendiceal orifice polyp.  Patient had a screening colonoscopy on 07/18/2019.  This has been her first colonoscopy.  Patient reports that her brother has a history of colon cancer.  She denies any blood in her stool, changes in bowel habits or constipation or diarrhea or abdominal pain nausea vomiting.  Denies any weight loss, chest pain, shortness of breath.  On colonoscopy, she was noted to have 7 mm polyp at the appendiceal orifice.  However based on the location it was not able to be retrieved or resected.  Thus she was referred to surgery for excision.  Past Medical History: Past Medical History:  Diagnosis Date  . Arthritis   . Diabetes mellitus without complication ([place])   . High cholesterol   . Hypertension      Past Surgical History: Past Surgical History:  Procedure Laterality Date  . ABDOMINAL HYSTERECTOMY    . CHOLECYSTECTOMY    . COLONOSCOPY WITH PROPOFOL N/A 07/18/2019   Procedure: COLONOSCOPY WITH PROPOFOL;  Surgeon: Lin Landsman, MD;  Location: [facility] ENDOSCOPY;  Service: Gastroenterology;  Laterality: N/A;  . ESOPHAGOGASTRODUODENOSCOPY (EGD) WITH PROPOFOL N/A 07/18/2019   Procedure: ESOPHAGOGASTRODUODENOSCOPY (EGD) WITH PROPOFOL;  Surgeon: Lin Landsman, MD;  Location: [facility] ENDOSCOPY;  Service: Gastroenterology;  Laterality: N/A;    Home Medications: Prior to Admission medications   Medication Sig Start Date End Date Taking? Authorizing Provider  albuterol (VENTOLIN HFA) 108 (90 Base) MCG/ACT inhaler Inhale into the lungs.   Yes [provider]  atenolol (TENORMIN) 25 MG tablet Take 25 mg by mouth daily. 05/21/19  Yes [provider]  atorvastatin (LIPITOR) 40 MG tablet Take 40 mg by mouth daily. 01/24/19  Yes [provider]   cyclobenzaprine (FLEXERIL) 10 MG tablet Take 1 tablet (10 mg total) by mouth 2 (two) times daily as needed for muscle spasms. 05/14/17  Yes Plunkett, Loree Fee, MD  enalapril (VASOTEC) 2.5 MG tablet Take by mouth.   Yes [provider]  GLIPIZIDE XL 10 MG 24 hr tablet Take 10 mg by mouth daily. 05/19/19  Yes [provider]  metFORMIN (GLUCOPHAGE-XR) 500 MG 24 hr tablet Take 1,000 mg by mouth 2 (two) times daily. 05/19/19  Yes [provider]  omeprazole (PRILOSEC) 20 MG capsule Take 20 mg by mouth daily. 05/21/19  Yes [provider]  sertraline (ZOLOFT) 100 MG tablet Take 100 mg by mouth daily. 05/15/19  Yes [provider]  TRADJENTA 5 MG TABS tablet Take 5 mg by mouth daily. 05/13/19  Yes [provider]  triamterene-hydrochlorothiazide (MAXZIDE) 75-50 MG tablet Take 1 tablet by mouth daily. 05/21/19  Yes [provider]  zolpidem (AMBIEN) 5 MG tablet Take 5 mg by mouth at bedtime as needed. 06/21/19  Yes [provider]    Allergies: Allergies  Allergen Reactions  . Codeine     Social History:  reports that she has never smoked. She has never used smokeless tobacco. She reports that she does not drink alcohol or use drugs.   Family History: Family History  Problem Relation Age of Onset  . Heart disease Mother   . Heart disease Father     Review of Systems: Review of Systems  Constitutional: Negative for chills and fever.  HENT: Negative for hearing loss.   Respiratory: Negative for shortness of breath.  Cardiovascular: Negative for chest pain.  Gastrointestinal: Negative for abdominal pain, blood in stool, constipation, diarrhea, nausea and vomiting.  Genitourinary: Negative for dysuria.  Musculoskeletal: Negative for myalgias.  Skin: Negative for rash.  Neurological: Negative for dizziness.  Psychiatric/Behavioral: Negative for depression.    Physical Exam BP (!) 149/82   Pulse 83   Temp (!) 97 F (36.1  C)   Resp 12   Ht 5\' 5"  (1.651 m)   Wt 78.5 kg   SpO2 98%   BMI 28.79 kg/m  CONSTITUTIONAL: No acute distress HEENT:  Normocephalic, atraumatic, extraocular motion intact. NECK: Trachea is midline, and there is no jugular venous distension.  RESPIRATORY:  Lungs are clear, and breath sounds are equal bilaterally. Normal respiratory effort without pathologic use of accessory muscles. CARDIOVASCULAR: Heart is regular without murmurs, gallops, or rubs. GI: The abdomen is soft, nondistended, nontender to palpation patient has well-healed scars from a laparoscopic cholecystectomy as well as an open hysterectomy.  MUSCULOSKELETAL:  Normal muscle strength and tone in all four extremities.  No peripheral edema or cyanosis. SKIN: Skin turgor is normal. There are no pathologic skin lesions.  NEUROLOGIC:  Motor and sensation is grossly normal.  Cranial nerves are grossly intact. PSYCH:  Alert and oriented to person, place and time. Affect is normal.  Laboratory Analysis: No results found for this or any previous visit (from the past 24 hour(s)).  Imaging: Colonoscopy on 07/18/2019 - One 7 mm polyp at the appendiceal orifice. Resection not attempted. - Non-bleeding external hemorrhoids. - Diverticulosis in the left colon and in the right colon. - The examination was otherwise normal. - No specimens collected.  Assessment and Plan: This is a 63 y.o. female with newly diagnosed 7 mm polyp at the appendiceal orifice.  -Discussed with the patient based on the location, we can proceed with a laparoscopic appendectomy.  Discussed with her the role of laparoscopy and discussed the surgery at length.  Discussed with her the risks of bleeding, infection, injury to surrounding structures.  She is willing to proceed.  Discussed with her that during the surgery we will send the specimen immediately to pathology for assessment of margins that subsequently will be analyzed to check on what this polyp really is.   Discussed with her that this will be an outpatient procedure.  No bowel prep will be needed for this. -Based on timing for her, she will would like to schedule this case for early January.  We will schedule her for 08/19/2019.  Understands that she will need to get tested again for Covid prior to surgery.  Face-to-face time spent with the patient and care providers was 60 minutes, with more than 50% of the time spent counseling, educating, and coordinating care of the patient.     Melvyn Neth, [facility]

## 2019-08-06 ENCOUNTER — Telehealth: Payer: Self-pay | Admitting: Surgery

## 2019-08-06 NOTE — Telephone Encounter (Signed)
Pt has been advised of pre admission date/time, Covid Testing date and Surgery date.  Surgery Date: 08/19/19-laparoscopic appendectomy-[facility].  Preadmission Testing Date: 08/12/19 between 1-5:00pm-phone interview.  Covid Testing Date: 08/14/19 between 8-12:00pm - patient advised to go to the [facility] ([place])  [employer] Video sent via [employer] and [employer].  Patient has been made aware to call [phone], between 1-3:00pm the day before surgery, to find out what time to arrive.

## 2019-08-12 ENCOUNTER — Other Ambulatory Visit: Payer: Self-pay

## 2019-08-12 ENCOUNTER — Encounter
Admission: RE | Admit: 2019-08-12 | Discharge: 2019-08-12 | Disposition: A | Discharge status: Home / Self Care | Payer: Medicare Other | Source: Ambulatory Visit | Attending: Surgery | Admitting: Surgery

## 2019-08-12 HISTORY — DX: Gastro-esophageal reflux disease without esophagitis: K21.9

## 2019-08-12 HISTORY — DX: Depression, unspecified: F32.A

## 2019-08-12 HISTORY — DX: Unspecified asthma, uncomplicated: J45.909

## 2019-08-12 NOTE — Patient Instructions (Signed)
Your procedure is scheduled on: August 19, 2019 Report to Day Surgery on the 2nd floor of the [facility]. To find out your arrival time, please call [phone] between 1PM - 3PM on: August 18, 2019  REMEMBER: Instructions that are not followed completely may result in serious medical risk, up to and including death; or upon the discretion of your surgeon and anesthesiologist your surgery may need to be rescheduled.  Do not eat food after midnight the night before surgery.  No gum chewing, lozengers or hard candies.  You may however, drink CLEAR liquids up to 2 hours before you are scheduled to arrive for your surgery. Do not drink anything within 2 hours of the start of your surgery.  Clear liquids include: - water  Do NOT drink anything that is not on this list.  Type 1 and Type 2 diabetics should only drink water.  No Alcohol for 24 hours before or after surgery.  No Smoking including e-cigarettes for 24 hours prior to surgery.  No chewable tobacco products for at least 6 hours prior to surgery.  No nicotine patches on the day of surgery.  On the morning of surgery brush your teeth with toothpaste and water, you may rinse your mouth with mouthwash if you wish. Do not swallow any toothpaste or mouthwash.  Notify your doctor if there is any change in your medical condition (cold, fever, infection).  Do not wear jewelry, make-up, hairpins, clips or nail polish.  Do not wear lotions, powders, or perfumes.   Do not shave 48 hours prior to surgery.   Contacts and dentures may not be worn into surgery.  Do not bring valuables to the hospital, including drivers license, insurance or credit cards.  [facility] is not responsible for any belongings or valuables.   TAKE THESE MEDICATIONS THE MORNING OF SURGERY: ATENOLOL OMEPRAZOLE (take one the night before and one on the morning of surgery - helps to prevent nausea after surgery.)  Use CHG Soap or wipes as directed on  instruction sheet. . Use inhalers on the day of surgery l.  Stop Metformin   2 days prior to surgery. LAST DOSE August 16, 2019  Follow recommendations from Cardiologist, Pulmonologist or PCP regarding stopping Aspirin, Coumadin, Plavix, Eliquis, Pradaxa, or Pletal.  Stop Anti-inflammatories (NSAIDS) such as Advil, Aleve, Ibuprofen, Motrin, Naproxen, Naprosyn and Aspirin based products such as Excedrin, Goodys Powder, BC Powder. (May take Tylenol or Acetaminophen if needed.)  Stop ANY OVER THE COUNTER supplements until after surgery. (May continue Vitamin D, Vitamin B, and multivitamin.)  Wear comfortable clothing (specific to your surgery type) to the hospital.  Plan for stool softeners for home use.  If you are being discharged the day of surgery, you will not be allowed to drive home. You will need a responsible adult to drive you home and stay with you that night.   If you are taking public transportation, you will need to have a responsible adult with you. Please confirm with your physician that it is acceptable to use public transportation.   Please call [phone] if you have any questions about these instructions.

## 2019-08-14 ENCOUNTER — Encounter
Admission: RE | Admit: 2019-08-14 | Discharge: 2019-08-14 | Disposition: A | Discharge status: Home / Self Care | Payer: Medicare Other | Source: Ambulatory Visit | Attending: Surgery | Admitting: Surgery

## 2019-08-14 ENCOUNTER — Other Ambulatory Visit: Admission: RE | Admit: 2019-08-14 | Payer: Medicare Other | Source: Ambulatory Visit

## 2019-08-14 ENCOUNTER — Other Ambulatory Visit: Payer: Self-pay

## 2019-08-14 DIAGNOSIS — Z01818 Encounter for other preprocedural examination: Secondary | ICD-10-CM | POA: Insufficient documentation

## 2019-08-14 DIAGNOSIS — D121 Benign neoplasm of appendix: Secondary | ICD-10-CM | POA: Insufficient documentation

## 2019-08-14 DIAGNOSIS — I10 Essential (primary) hypertension: Secondary | ICD-10-CM | POA: Diagnosis not present

## 2019-08-14 DIAGNOSIS — Z20828 Contact with and (suspected) exposure to other viral communicable diseases: Secondary | ICD-10-CM | POA: Diagnosis not present

## 2019-08-14 LAB — CBC
HCT: 36.9 % (ref 36.0–46.0)
Hemoglobin: 11.4 g/dL — ABNORMAL LOW (ref 12.0–15.0)
MCH: 24.6 pg — ABNORMAL LOW (ref 26.0–34.0)
MCHC: 30.9 g/dL (ref 30.0–36.0)
MCV: 79.5 fL — ABNORMAL LOW (ref 80.0–100.0)
Platelets: 296 10*3/uL (ref 150–400)
RBC: 4.64 MIL/uL (ref 3.87–5.11)
RDW: 13.8 % (ref 11.5–15.5)
WBC: 7.4 10*3/uL (ref 4.0–10.5)
nRBC: 0 % (ref 0.0–0.2)

## 2019-08-14 LAB — BASIC METABOLIC PANEL
Anion gap: 13 (ref 5–15)
BUN: 22 mg/dL (ref 8–23)
CO2: 26 mmol/L (ref 22–32)
Calcium: 9.6 mg/dL (ref 8.9–10.3)
Chloride: 96 mmol/L — ABNORMAL LOW (ref 98–111)
Creatinine, Ser: 0.91 mg/dL (ref 0.44–1.00)
GFR calc Af Amer: >60 mL/min (ref >60)
GFR calc non Af Amer: >60 mL/min (ref >60)
Glucose, Bld: 207 mg/dL — ABNORMAL HIGH (ref 70–99)
Potassium: 3.6 mmol/L (ref 3.5–5.1)
Sodium: 135 mmol/L (ref 135–145)

## 2019-08-14 LAB — SARS CORONAVIRUS 2 (TAT 6-24 HRS): SARS Coronavirus 2: NEGATIVE

## 2019-08-18 MED ORDER — SODIUM CHLORIDE 0.9 % IV SOLN
2.0000 g | INTRAVENOUS | Status: AC
  Administered 2019-08-19: 09:00:00 2 g via INTRAVENOUS
  Filled 2019-08-18: qty 2

## 2019-08-19 ENCOUNTER — Other Ambulatory Visit: Payer: Self-pay

## 2019-08-19 ENCOUNTER — Ambulatory Visit: Payer: Medicare Other | Admitting: Anesthesiology

## 2019-08-19 ENCOUNTER — Ambulatory Visit
Admission: RE | Admit: 2019-08-19 | Discharge: 2019-08-19 | Disposition: A | Discharge status: Home / Self Care | Payer: Medicare Other | Source: Ambulatory Visit | Attending: Surgery | Admitting: Surgery

## 2019-08-19 ENCOUNTER — Encounter
Admission: RE | Disposition: A | Discharge status: Home / Self Care | Payer: Self-pay | Source: Ambulatory Visit | Attending: Surgery

## 2019-08-19 ENCOUNTER — Encounter: Payer: Self-pay | Admitting: Surgery

## 2019-08-19 DIAGNOSIS — Z7984 Long term (current) use of oral hypoglycemic drugs: Secondary | ICD-10-CM | POA: Diagnosis not present

## 2019-08-19 DIAGNOSIS — M1990 Unspecified osteoarthritis, unspecified site: Secondary | ICD-10-CM | POA: Diagnosis not present

## 2019-08-19 DIAGNOSIS — J45909 Unspecified asthma, uncomplicated: Secondary | ICD-10-CM | POA: Insufficient documentation

## 2019-08-19 DIAGNOSIS — Z90710 Acquired absence of both cervix and uterus: Secondary | ICD-10-CM | POA: Insufficient documentation

## 2019-08-19 DIAGNOSIS — E119 Type 2 diabetes mellitus without complications: Secondary | ICD-10-CM | POA: Insufficient documentation

## 2019-08-19 DIAGNOSIS — K388 Other specified diseases of appendix: Secondary | ICD-10-CM

## 2019-08-19 DIAGNOSIS — E7800 Pure hypercholesterolemia, unspecified: Secondary | ICD-10-CM | POA: Insufficient documentation

## 2019-08-19 DIAGNOSIS — I10 Essential (primary) hypertension: Secondary | ICD-10-CM | POA: Diagnosis not present

## 2019-08-19 DIAGNOSIS — F419 Anxiety disorder, unspecified: Secondary | ICD-10-CM | POA: Diagnosis not present

## 2019-08-19 DIAGNOSIS — Z8249 Family history of ischemic heart disease and other diseases of the circulatory system: Secondary | ICD-10-CM | POA: Insufficient documentation

## 2019-08-19 DIAGNOSIS — D121 Benign neoplasm of appendix: Secondary | ICD-10-CM | POA: Insufficient documentation

## 2019-08-19 DIAGNOSIS — Z800 Family history of malignant neoplasm of digestive organs: Secondary | ICD-10-CM | POA: Insufficient documentation

## 2019-08-19 DIAGNOSIS — Z9049 Acquired absence of other specified parts of digestive tract: Secondary | ICD-10-CM | POA: Diagnosis not present

## 2019-08-19 DIAGNOSIS — Z885 Allergy status to narcotic agent status: Secondary | ICD-10-CM | POA: Diagnosis not present

## 2019-08-19 DIAGNOSIS — K219 Gastro-esophageal reflux disease without esophagitis: Secondary | ICD-10-CM | POA: Insufficient documentation

## 2019-08-19 DIAGNOSIS — Z87891 Personal history of nicotine dependence: Secondary | ICD-10-CM | POA: Diagnosis not present

## 2019-08-19 DIAGNOSIS — Z79899 Other long term (current) drug therapy: Secondary | ICD-10-CM | POA: Insufficient documentation

## 2019-08-19 HISTORY — PX: LAPAROSCOPIC APPENDECTOMY: SHX408

## 2019-08-19 LAB — GLUCOSE, CAPILLARY
Glucose-Capillary: 272 mg/dL — ABNORMAL HIGH (ref 70–99)
Glucose-Capillary: 238 mg/dL — ABNORMAL HIGH (ref 70–99)

## 2019-08-19 SURGERY — APPENDECTOMY, LAPAROSCOPIC
Anesthesia: General

## 2019-08-19 MED ORDER — EPHEDRINE SULFATE 50 MG/ML IJ SOLN
INTRAMUSCULAR | Status: AC
  Filled 2019-08-19: qty 1

## 2019-08-19 MED ORDER — FENTANYL CITRATE (PF) 100 MCG/2ML IJ SOLN
INTRAMUSCULAR | Status: AC
  Filled 2019-08-19: qty 2

## 2019-08-19 MED ORDER — GABAPENTIN 300 MG PO CAPS
ORAL_CAPSULE | ORAL | Status: AC
  Filled 2019-08-19: qty 1

## 2019-08-19 MED ORDER — SUCCINYLCHOLINE CHLORIDE 20 MG/ML IJ SOLN
INTRAMUSCULAR | Status: AC
  Filled 2019-08-19: qty 1

## 2019-08-19 MED ORDER — PROPOFOL 10 MG/ML IV BOLUS
INTRAVENOUS | Status: DC | PRN
  Administered 2019-08-19: 30 mg via INTRAVENOUS
  Administered 2019-08-19: 10 mg via INTRAVENOUS
  Administered 2019-08-19: 150 mg via INTRAVENOUS

## 2019-08-19 MED ORDER — OXYCODONE HCL 5 MG PO TABS
ORAL_TABLET | ORAL | Status: AC
  Administered 2019-08-19: 13:00:00 5 mg via ORAL
  Filled 2019-08-19: qty 1

## 2019-08-19 MED ORDER — PROPOFOL 10 MG/ML IV BOLUS
INTRAVENOUS | Status: AC
  Filled 2019-08-19: qty 20

## 2019-08-19 MED ORDER — EPHEDRINE SULFATE 50 MG/ML IJ SOLN
INTRAMUSCULAR | Status: DC | PRN
  Administered 2019-08-19: 10 mg via INTRAVENOUS

## 2019-08-19 MED ORDER — ACETAMINOPHEN 500 MG PO TABS
ORAL_TABLET | ORAL | Status: AC
  Filled 2019-08-19: qty 2

## 2019-08-19 MED ORDER — DEXAMETHASONE SODIUM PHOSPHATE 10 MG/ML IJ SOLN
INTRAMUSCULAR | Status: AC
  Filled 2019-08-19: qty 1

## 2019-08-19 MED ORDER — EPINEPHRINE PF 1 MG/ML IJ SOLN
INTRAMUSCULAR | Status: AC
  Filled 2019-08-19: qty 1

## 2019-08-19 MED ORDER — DEXAMETHASONE SODIUM PHOSPHATE 10 MG/ML IJ SOLN
INTRAMUSCULAR | Status: DC | PRN
  Administered 2019-08-19: 5 mg via INTRAVENOUS

## 2019-08-19 MED ORDER — CHLORHEXIDINE GLUCONATE CLOTH 2 % EX PADS
6.0000 | MEDICATED_PAD | Freq: Once | CUTANEOUS | Status: AC
  Administered 2019-08-19: 08:00:00 6 via TOPICAL

## 2019-08-19 MED ORDER — MIDAZOLAM HCL 2 MG/2ML IJ SOLN
INTRAMUSCULAR | Status: AC
  Filled 2019-08-19: qty 2

## 2019-08-19 MED ORDER — INSULIN ASPART 100 UNIT/ML ~~LOC~~ SOLN
5.0000 [IU] | Freq: Once | SUBCUTANEOUS | Status: AC
  Administered 2019-08-19: 09:00:00 5 [IU] via SUBCUTANEOUS

## 2019-08-19 MED ORDER — SUCCINYLCHOLINE CHLORIDE 20 MG/ML IJ SOLN
INTRAMUSCULAR | Status: DC | PRN
  Administered 2019-08-19: 80 mg via INTRAVENOUS

## 2019-08-19 MED ORDER — BUPIVACAINE HCL (PF) 0.5 % IJ SOLN
INTRAMUSCULAR | Status: AC
  Filled 2019-08-19: qty 30

## 2019-08-19 MED ORDER — LIDOCAINE HCL (CARDIAC) PF 100 MG/5ML IV SOSY
PREFILLED_SYRINGE | INTRAVENOUS | Status: DC | PRN
  Administered 2019-08-19: 80 mg via INTRAVENOUS

## 2019-08-19 MED ORDER — MIDAZOLAM HCL 2 MG/2ML IJ SOLN
INTRAMUSCULAR | Status: DC | PRN
  Administered 2019-08-19: 2 mg via INTRAVENOUS

## 2019-08-19 MED ORDER — ONDANSETRON HCL 4 MG/2ML IJ SOLN
INTRAMUSCULAR | Status: DC | PRN
  Administered 2019-08-19: 4 mg via INTRAVENOUS

## 2019-08-19 MED ORDER — PHENYLEPHRINE HCL-NACL 10-0.9 MG/250ML-% IV SOLN
INTRAVENOUS | Status: DC | PRN
  Administered 2019-08-19: 30 ug/min via INTRAVENOUS

## 2019-08-19 MED ORDER — ONDANSETRON HCL 4 MG/2ML IJ SOLN
INTRAMUSCULAR | Status: AC
  Filled 2019-08-19: qty 2

## 2019-08-19 MED ORDER — HYDROCODONE-ACETAMINOPHEN 5-325 MG PO TABS: 1.0000 | ORAL_TABLET | ORAL | 0 refills | Status: DC | PRN

## 2019-08-19 MED ORDER — ONDANSETRON HCL 4 MG/2ML IJ SOLN: 4.0000 mg | Freq: Once | INTRAMUSCULAR | Status: DC | PRN

## 2019-08-19 MED ORDER — FENTANYL CITRATE (PF) 100 MCG/2ML IJ SOLN
25.0000 ug | INTRAMUSCULAR | Status: DC | PRN
  Administered 2019-08-19 (×3): 25 ug via INTRAVENOUS

## 2019-08-19 MED ORDER — ACETAMINOPHEN 500 MG PO TABS
1000.0000 mg | ORAL_TABLET | ORAL | Status: AC
  Administered 2019-08-19: 08:00:00 1000 mg via ORAL

## 2019-08-19 MED ORDER — BUPIVACAINE-EPINEPHRINE (PF) 0.5% -1:200000 IJ SOLN
INTRAMUSCULAR | Status: DC | PRN
  Administered 2019-08-19: 30 mL

## 2019-08-19 MED ORDER — FENTANYL CITRATE (PF) 100 MCG/2ML IJ SOLN
INTRAMUSCULAR | Status: DC | PRN
  Administered 2019-08-19 (×2): 100 ug via INTRAVENOUS

## 2019-08-19 MED ORDER — SODIUM CHLORIDE 0.9 % IV SOLN: INTRAVENOUS | Status: DC

## 2019-08-19 MED ORDER — LACTATED RINGERS IV SOLN: INTRAVENOUS | Status: DC | PRN

## 2019-08-19 MED ORDER — INSULIN ASPART 100 UNIT/ML ~~LOC~~ SOLN
SUBCUTANEOUS | Status: AC
  Filled 2019-08-19: qty 1

## 2019-08-19 MED ORDER — PHENYLEPHRINE HCL (PRESSORS) 10 MG/ML IV SOLN
INTRAVENOUS | Status: DC | PRN
  Administered 2019-08-19 (×2): 100 ug via INTRAVENOUS

## 2019-08-19 MED ORDER — DEXMEDETOMIDINE HCL 200 MCG/2ML IV SOLN
INTRAVENOUS | Status: DC | PRN
  Administered 2019-08-19: 12 ug via INTRAVENOUS

## 2019-08-19 MED ORDER — CEPHALEXIN 500 MG PO CAPS: 500.0000 mg | ORAL_CAPSULE | Freq: Two times a day (BID) | ORAL | 0 refills | Status: AC

## 2019-08-19 MED ORDER — FENTANYL CITRATE (PF) 100 MCG/2ML IJ SOLN
INTRAMUSCULAR | Status: AC
  Administered 2019-08-19: 12:00:00 25 ug via INTRAVENOUS
  Filled 2019-08-19: qty 2

## 2019-08-19 MED ORDER — OXYCODONE HCL 5 MG/5ML PO SOLN: 5.0000 mg | Freq: Once | ORAL | Status: AC | PRN

## 2019-08-19 MED ORDER — ONDANSETRON HCL 4 MG PO TABS: 4.0000 mg | ORAL_TABLET | Freq: Three times a day (TID) | ORAL | 0 refills | Status: DC | PRN

## 2019-08-19 MED ORDER — ROCURONIUM BROMIDE 100 MG/10ML IV SOLN
INTRAVENOUS | Status: DC | PRN
  Administered 2019-08-19: 10 mg via INTRAVENOUS
  Administered 2019-08-19: 30 mg via INTRAVENOUS

## 2019-08-19 MED ORDER — CHLORHEXIDINE GLUCONATE CLOTH 2 % EX PADS: 6.0000 | MEDICATED_PAD | Freq: Once | CUTANEOUS | Status: DC

## 2019-08-19 MED ORDER — SEVOFLURANE IN SOLN
RESPIRATORY_TRACT | Status: AC
  Filled 2019-08-19: qty 250

## 2019-08-19 MED ORDER — GABAPENTIN 300 MG PO CAPS
300.0000 mg | ORAL_CAPSULE | ORAL | Status: AC
  Administered 2019-08-19: 300 mg via ORAL

## 2019-08-19 MED ORDER — PHENYLEPHRINE HCL (PRESSORS) 10 MG/ML IV SOLN
INTRAVENOUS | Status: AC
  Filled 2019-08-19: qty 1

## 2019-08-19 MED ORDER — SUGAMMADEX SODIUM 200 MG/2ML IV SOLN
INTRAVENOUS | Status: DC | PRN
  Administered 2019-08-19: 200 mg via INTRAVENOUS

## 2019-08-19 MED ORDER — OXYCODONE HCL 5 MG PO TABS: 5.0000 mg | ORAL_TABLET | Freq: Once | ORAL | Status: AC | PRN

## 2019-08-19 MED ORDER — ROCURONIUM BROMIDE 50 MG/5ML IV SOLN
INTRAVENOUS | Status: AC
  Filled 2019-08-19: qty 1

## 2019-08-19 SURGICAL SUPPLY — 47 items
ADH SKN CLS APL DERMABOND .7 (GAUZE/BANDAGES/DRESSINGS) ×1
APL PRP STRL LF DISP 70% ISPRP (MISCELLANEOUS) ×1
BAG SPEC RTRVL LRG 6X4 10 (ENDOMECHANICALS) ×1
CANISTER SUCT 1200ML W/VALVE (MISCELLANEOUS) ×2 IMPLANT
CHLORAPREP W/TINT 26 (MISCELLANEOUS) ×2 IMPLANT
COVER WAND RF STERILE (DRAPES) ×2 IMPLANT
CUTTER FLEX LINEAR 45M (STAPLE) IMPLANT
DERMABOND ADVANCED (GAUZE/BANDAGES/DRESSINGS) ×1
DERMABOND ADVANCED .7 DNX12 (GAUZE/BANDAGES/DRESSINGS) ×1 IMPLANT
ELECT CAUTERY BLADE 6.4 (BLADE) ×2 IMPLANT
ELECT REM PT RETURN 9FT ADLT (ELECTROSURGICAL) ×2
ELECTRODE REM PT RTRN 9FT ADLT (ELECTROSURGICAL) ×1 IMPLANT
GLOVE SURG SYN 7.0 (GLOVE) ×2 IMPLANT
GLOVE SURG SYN 7.0 PF PI (GLOVE) ×1 IMPLANT
GLOVE SURG SYN 7.5  E (GLOVE) ×1
GLOVE SURG SYN 7.5 E (GLOVE) ×1 IMPLANT
GLOVE SURG SYN 7.5 PF PI (GLOVE) ×1 IMPLANT
GOWN STRL REUS W/ TWL LRG LVL3 (GOWN DISPOSABLE) ×2 IMPLANT
GOWN STRL REUS W/TWL LRG LVL3 (GOWN DISPOSABLE) ×4
IRRIGATION STRYKERFLOW (MISCELLANEOUS) IMPLANT
IRRIGATOR STRYKERFLOW (MISCELLANEOUS)
IV NS 1000ML (IV SOLUTION)
IV NS 1000ML BAXH (IV SOLUTION) ×1 IMPLANT
KIT TURNOVER KIT A (KITS) ×2 IMPLANT
LABEL OR SOLS (LABEL) ×1 IMPLANT
LIGASURE LAP MARYLAND 5MM 37CM (ELECTROSURGICAL) IMPLANT
NEEDLE HYPO 22GX1.5 SAFETY (NEEDLE) ×2 IMPLANT
NS IRRIG 500ML POUR BTL (IV SOLUTION) ×2 IMPLANT
PACK LAP CHOLECYSTECTOMY (MISCELLANEOUS) ×2 IMPLANT
PENCIL ELECTRO HAND CTR (MISCELLANEOUS) ×2 IMPLANT
POUCH SPECIMEN RETRIEVAL 10MM (ENDOMECHANICALS) ×2 IMPLANT
RELOAD 45 VASCULAR/THIN (ENDOMECHANICALS) IMPLANT
RELOAD STAPLE 45 2.5 WHT GRN (ENDOMECHANICALS) IMPLANT
RELOAD STAPLE 45 3.5 BLU ETS (ENDOMECHANICALS) ×1 IMPLANT
RELOAD STAPLE TA45 3.5 REG BLU (ENDOMECHANICALS) ×6 IMPLANT
SCISSORS METZENBAUM CVD 33 (INSTRUMENTS) ×2 IMPLANT
SLEEVE ADV FIXATION 5X100MM (TROCAR) ×4 IMPLANT
SUT MNCRL 4-0 (SUTURE) ×4
SUT MNCRL 4-0 27XMFL (SUTURE) ×2
SUT VIC AB 3-0 SH 27 (SUTURE) ×2
SUT VIC AB 3-0 SH 27X BRD (SUTURE) IMPLANT
SUT VICRYL 0 AB UR-6 (SUTURE) ×4 IMPLANT
SUTURE MNCRL 4-0 27XMF (SUTURE) ×1 IMPLANT
TRAY FOLEY MTR SLVR 16FR STAT (SET/KITS/TRAYS/PACK) ×2 IMPLANT
TROCAR BALLN GELPORT 12X130M (ENDOMECHANICALS) ×2 IMPLANT
TROCAR Z-THREAD OPTICAL 5X100M (TROCAR) ×2 IMPLANT
TUBING EVAC SMOKE HEATED PNEUM (TUBING) ×2 IMPLANT

## 2019-08-19 NOTE — Anesthesia Postprocedure Evaluation (Signed)
Anesthesia Post Note  Patient: Heather Carter  Procedure(s) Performed: APPENDECTOMY LAPAROSCOPIC (N/A )  Patient location during evaluation: PACU Anesthesia Type: General Level of consciousness: awake and alert Pain management: pain level controlled Vital Signs Assessment: post-procedure vital signs reviewed and stable Respiratory status: spontaneous breathing, nonlabored ventilation, respiratory function stable and patient connected to nasal cannula oxygen Cardiovascular status: blood pressure returned to baseline and stable Postop Assessment: no apparent nausea or vomiting Anesthetic complications: no Comments: Fingerstick glucose down to 238 from 275. Not on insulin at home; OK to discharge and resume home diabetes meds     Last Vitals:  Vitals:   08/19/19 1154 08/19/19 1209  BP: 137/68 117/62  Pulse: 84 85  Resp: 11 (!) 0  Temp:    SpO2: 99% 95%    Last Pain:  Vitals:   08/19/19 1154  TempSrc:   PainSc: 5                  Arita Miss

## 2019-08-19 NOTE — Anesthesia Procedure Notes (Signed)
Procedure Name: Intubation Date/Time: 08/19/2019 9:13 AM Performed by: Justus Memory, CRNA Pre-anesthesia Checklist: Patient identified, Patient being monitored, Timeout performed, Emergency Drugs available and Suction available Patient Re-evaluated:Patient Re-evaluated prior to induction Oxygen Delivery Method: Circle system utilized Preoxygenation: Pre-oxygenation with 100% oxygen Induction Type: IV induction, Rapid sequence and Cricoid Pressure applied Laryngoscope Size: 3 and McGraph Grade View: Grade I Tube type: Oral Tube size: 7.0 mm Number of attempts: 1 Airway Equipment and Method: Stylet and Video-laryngoscopy Placement Confirmation: ETT inserted through vocal cords under direct vision,  positive ETCO2 and breath sounds checked- equal and bilateral Secured at: 22 (lip) cm Tube secured with: Tape Dental Injury: Teeth and Oropharynx as per pre-operative assessment

## 2019-08-19 NOTE — Transfer of Care (Signed)
Immediate Anesthesia Transfer of Care Note  Patient: Heather Carter  Procedure(s) Performed: APPENDECTOMY LAPAROSCOPIC (N/A )  Patient Location: PACU  Anesthesia Type:General  Level of Consciousness: sedated  Airway & Oxygen Therapy: Patient Spontanous Breathing and Patient connected to face mask oxygen  Post-op Assessment: Report given to RN and Post -op Vital signs reviewed and stable  Post vital signs: Reviewed and stable  Last Vitals:  Vitals Value Taken Time  BP 150/77 08/19/19 1124  Temp 37.1 C 08/19/19 1124  Pulse 91 08/19/19 1130  Resp 17 08/19/19 1130  SpO2 98 % 08/19/19 1130  Vitals shown include unvalidated device data.  Last Pain:  Vitals:   08/19/19 0734  TempSrc: Tympanic         Complications: No apparent anesthesia complications

## 2019-08-19 NOTE — Op Note (Signed)
  Procedure Date:  08/19/2019  Pre-operative Diagnosis:  Appendiceal orifice polyp  Post-operative Diagnosis:  Appendiceal orifice polyp  Procedure:  Laparoscopic appendectomy  Surgeon:  Melvyn Neth, MD  Assistant:  Clemetine Marker, PA-S  Anesthesia:  General endotracheal  Estimated Blood Loss:  2 ml  Specimens:  appendix  Complications:  none  Indications for Procedure:  This is a 64 y.o. female who presents with an appendiceal orifice polyp noted on recent colonoscopy.  The options of surgery versus observation were reviewed with the patient and/or family. The risks of bleeding, infection, recurrence of symptoms, negative laparoscopy, potential for an open procedure, bowel injury, abscess or infection, were all discussed with the patient and she was willing to proceed.  Description of Procedure: The patient was correctly identified in the preoperative area and brought into the operating room.  The patient was placed supine with VTE prophylaxis in place.  Appropriate time-outs were performed.  Anesthesia was induced and the patient was intubated.  Foley catheter was placed.  Appropriate antibiotics were infused.  The abdomen was prepped and draped in a sterile fashion. An supraumbilical incision was made at the site of prior cholecystectomy port. A cutdown technique was used to enter the abdominal cavity without injury, and a Hasson trocar was inserted.  Pneumoperitoneum was obtained with appropriate opening pressures.  Two 5-mm ports were placed in the suprapubic and left lateral positions under direct visualization.  The right lower quadrant was inspected and the appendix was identified.  The patient had adhesions of the omentum to the anterior abdominal wall which were taken down using the LigaSure.  The appendix was carefully dissected.  The mesoappendix was divided using the LigaSure.  The base of the appendix was dissected out and divided with a standard load Endo GIA, including a  small margin of cecum due to the location of the polyp.  The appendix was placed in an Endocatch bag and brought out through the umbilical incision and sent to pathology for margins.  Small amount of oozing at the staple line was controlled with cautery.  The right lower quadrant was then inspected again revealing an intact staple line, no bleeding, and no bowel injury.  The 5 mm ports were removed under direct visualization and the Hasson trocar was removed.  There was mild oozing at the left lateral port site which was next to the epigastric vessels, but there was no significant bleeding and no hematoma forming.  The fascial opening was closed using 0 vicryl suture.  Local anesthetic was infused in all incisions and the incisions were closed with 4-0 Monocryl.  The wounds were cleaned and sealed with DermaBond.  Foley catheter was removed and the patient was emerged from anesthesia and extubated and brought to the recovery room for further management.  The patient tolerated the procedure well and all counts were correct at the end of the case.   Melvyn Neth, MD

## 2019-08-19 NOTE — Discharge Instructions (Signed)

## 2019-08-19 NOTE — Interval H&P Note (Signed)
History and Physical Interval Note:  08/19/2019 8:44 AM  Heather Carter  has presented today for surgery, with the diagnosis of Appendiceal orifice polyp.  The various methods of treatment have been discussed with the patient and family. After consideration of risks, benefits and other options for treatment, the patient has consented to  Procedure(s): APPENDECTOMY LAPAROSCOPIC (N/A) as a surgical intervention.  The patient's history has been reviewed, patient examined, no change in status, stable for surgery.  I have reviewed the patient's chart and labs.  Questions were answered to the patient's satisfaction.     Heather Carter

## 2019-08-19 NOTE — Anesthesia Preprocedure Evaluation (Signed)
Anesthesia Evaluation  Patient identified by MRN, date of birth, ID band Patient awake    Reviewed: Allergy & Precautions, NPO status , Patient's Chart, lab work & pertinent test results  History of Anesthesia Complications Negative for: history of anesthetic complications  Airway Mallampati: III  TM Distance: >3 FB Neck ROM: Full    Dental no notable dental hx. (+) Teeth Intact   Pulmonary asthma , neg COPD, Patient abstained from smoking.Not current smoker, former smoker,  Mild asthma, took inhaler this morning. Never hospitalized   Pulmonary exam normal breath sounds clear to auscultation       Cardiovascular Exercise Tolerance: Good METShypertension, Pt. on medications (-) CAD and (-) Past MI negative cardio ROS   Rhythm:Regular Rate:Normal - Systolic murmurs    Neuro/Psych Anxiety negative neurological ROS  negative psych ROS   GI/Hepatic Neg liver ROS, GERD  ,  Endo/Other  diabetes, Poorly Controlled, Type 2, Insulin Dependent  Renal/GU negative Renal ROS     Musculoskeletal   Abdominal   Peds  Hematology   Anesthesia Other Findings   Reproductive/Obstetrics                             Anesthesia Physical Anesthesia Plan  ASA: II  Anesthesia Plan: General   Post-op Pain Management:    Induction: Intravenous  PONV Risk Score and Plan: 4 or greater and Ondansetron, Dexamethasone and Midazolam  Airway Management Planned: Oral ETT  Additional Equipment: None  Intra-op Plan:   Post-operative Plan: Extubation in OR  Informed Consent: I have reviewed the patients History and Physical, chart, labs and discussed the procedure including the risks, benefits and alternatives for the proposed anesthesia with the patient or authorized representative who has indicated his/her understanding and acceptance.     Dental advisory given  Plan Discussed with: CRNA and  Surgeon  Anesthesia Plan Comments: (Discussed risks of anesthesia with patient, including PONV, sore throat, lip/dental damage. Rare risks discussed as well, such as cardiorespiratory sequelae. Patient understands. Patient took albuterol inhaler a few hours ago)        Anesthesia Quick Evaluation

## 2019-08-20 LAB — SURGICAL PATHOLOGY

## 2019-08-21 ENCOUNTER — Telehealth: Payer: Self-pay | Admitting: Gastroenterology

## 2019-08-21 NOTE — Telephone Encounter (Signed)
Please update her surveillance interval for next colonoscopy to 5 years which will be 07/2024 given the size of the polyp.  Thanks RV

## 2019-08-21 NOTE — Progress Notes (Signed)
Called patient today to review the results of her appendectomy.  Appendix with polyp at the orifice was removed intact, with 5 mm margin to the staple line.  The polyp was a sessile serrated polyp, negative for dysplasia or malignancy.  Patient is currently doing well and will see me on 09/03/19 for follow up.  Olean Ree, MD

## 2019-08-27 ENCOUNTER — Telehealth: Payer: Self-pay | Admitting: *Deleted

## 2019-08-27 NOTE — Telephone Encounter (Signed)
Patient called and wanted to see about getting another refill on Cephalexin 500mg  and hydrocodone. Please call and advise   Had surgery on 08/19/19 appendectomy by Dr.Piscoya

## 2019-08-27 NOTE — Telephone Encounter (Signed)
Per Dr.Piscoya advised me to contact patient to gather more information as to why the patient was requesting refills on the pain medication (Hydrocodone) and antibiotic (Cephaflexin). Patient states she wanted a refill on the antibiotic because she noticed the bump in the navel area wasn't going away after the completion of the medication. She denies having any discharge, fever/chills or hot to touch to the area. Patient states she will keep her appointment to follow up with Dr.Piscoya and if the area becomes worse she would give our office a call to be seen before scheduled appointment.

## 2019-08-27 NOTE — Telephone Encounter (Signed)
Spoke with patient to gather information as to how many tablet she has left of the Hydrocodone and antibiotic. Patient she has a few of the pain medication left, but she does not have any of the antibiotic left. Notified patient I would speak with Dr.Piscoya and see what his recommendations and next steps are and give her a call back. Please advise.

## 2019-08-27 NOTE — Telephone Encounter (Signed)
Health maintenance has been update to 5 years

## 2019-09-01 ENCOUNTER — Telehealth: Payer: Self-pay | Admitting: Surgery

## 2019-09-01 NOTE — Telephone Encounter (Signed)
Patient called post lap-appendectomy w/Dr. Hampton Abbot on 08/19/19, advising she lifted her granddaughter w/o thinking on 08/26/19.  She is concerned that around the following day or two (08/28/19), she had horrible burps & her stool was black.  She is unsure if lifting her grandchild could have caused her to bleed internally or not.  Heather Carter has taken a sample of her stool from 08/30/19, but is not sure where to take it for testing or what to do moving forward.  She has a virtual visit scheduled w/Dr. Hampton Abbot on Wed (09/03/19). Please advise by calling her back @ [phone].  Thank you

## 2019-09-01 NOTE — Telephone Encounter (Signed)
Message left for the patient to call the office back.

## 2019-09-02 NOTE — Telephone Encounter (Signed)
Left message for patient to call office.  

## 2019-09-03 ENCOUNTER — Other Ambulatory Visit: Payer: Self-pay

## 2019-09-03 ENCOUNTER — Telehealth (INDEPENDENT_AMBULATORY_CARE_PROVIDER_SITE_OTHER): Payer: Self-pay | Admitting: Surgery

## 2019-09-03 DIAGNOSIS — K388 Other specified diseases of appendix: Secondary | ICD-10-CM

## 2019-09-03 DIAGNOSIS — Z09 Encounter for follow-up examination after completed treatment for conditions other than malignant neoplasm: Secondary | ICD-10-CM

## 2019-09-03 NOTE — Telephone Encounter (Signed)
Patient a scheduled virtual office visit with Dr.Piscoya today 09/03/19 at 11 am.

## 2019-09-03 NOTE — Progress Notes (Signed)
Virtual Visit via Telephone Note  I connected with Heather Carter on 09/03/19 at 11:00 AM EST by telephone and verified that I am speaking with the correct person using two identifiers.  Location: Patient: Home Provider: Office   I discussed the limitations, risks, security and privacy concerns of performing an evaluation and management service by telephone and the availability of in person appointments. I also discussed with the patient that there may be a patient responsible charge related to this service. The patient expressed understanding and agreed to proceed.   History of Present Illness: This is a 64 yo female s/p laparoscopic appendectomy including a sliver of cecum for a mass at the appendiceal orifice.  Pathology resulted in a sessile serrated polyp without any dysplasia or malignancy.  She reports that she had dark stool a few days ago and burping after picking up her granddaughter.  However, that has since resolved.  Denies any issues with the incisions.   Observations/Objective: Does not seem to be in any acute distress.  Able to converse without any shortness of breath.  Assessment and Plan: 64 yo female s/p laparoscopic appendectomy  --Discussed with patient that it is not uncommon after surgery to have some dark or old blood given the staple line resection.  Since then, she has not had other episodes.   --Reminded her that she has two more weeks of no heavy lifting or pushing.  Reminded her to be more careful for now and not pick up her granddaughter. --Follow up prn  Follow Up Instructions:    I discussed the assessment and treatment plan with the patient. The patient was provided an opportunity to ask questions and all were answered. The patient agreed with the plan and demonstrated an understanding of the instructions.   The patient was advised to call back or seek an in-person evaluation if the symptoms worsen or if the condition fails to improve as  anticipated.  I provided 15 minutes of non-face-to-face time during this encounter.   Olean Ree, MD

## 2019-09-29 ENCOUNTER — Ambulatory Visit (HOSPITAL_COMMUNITY)
Admission: RE | Admit: 2019-09-29 | Discharge: 2019-09-29 | Disposition: A | Discharge status: Home / Self Care | Payer: Medicare Other | Source: Ambulatory Visit | Attending: Internal Medicine | Admitting: Internal Medicine

## 2019-09-29 ENCOUNTER — Other Ambulatory Visit: Payer: Self-pay

## 2019-09-29 DIAGNOSIS — M85832 Other specified disorders of bone density and structure, left forearm: Secondary | ICD-10-CM | POA: Diagnosis not present

## 2019-09-29 DIAGNOSIS — Z13820 Encounter for screening for osteoporosis: Secondary | ICD-10-CM | POA: Diagnosis present

## 2019-09-29 DIAGNOSIS — Z1231 Encounter for screening mammogram for malignant neoplasm of breast: Secondary | ICD-10-CM | POA: Diagnosis present

## 2020-01-26 ENCOUNTER — Ambulatory Visit (INDEPENDENT_AMBULATORY_CARE_PROVIDER_SITE_OTHER): Payer: Medicare Other | Admitting: Dermatology

## 2020-01-26 ENCOUNTER — Other Ambulatory Visit: Payer: Self-pay

## 2020-01-26 DIAGNOSIS — L820 Inflamed seborrheic keratosis: Secondary | ICD-10-CM | POA: Diagnosis not present

## 2020-01-26 DIAGNOSIS — L814 Other melanin hyperpigmentation: Secondary | ICD-10-CM

## 2020-01-26 DIAGNOSIS — L821 Other seborrheic keratosis: Secondary | ICD-10-CM | POA: Diagnosis not present

## 2020-01-26 NOTE — Progress Notes (Signed)
   New Patient Visit  Subjective  Heather Carter is a 64 y.o. female who presents for the following: growths (under breast, back, R groin, has had >71yrs, one on back painful) and redness (nose, >95yr). Spots get irritated by clothing.  The following portions of the chart were reviewed this encounter and updated as appropriate:      Review of Systems:  No other skin or systemic complaints except as noted in HPI or Assessment and Plan.  Objective  Well appearing patient in no apparent distress; mood and affect are within normal limits.  A focused examination was performed including face, trunk, groin. Relevant physical exam findings are noted in the Assessment and Plan.  Objective  L mid back x 2, R inframammory x 3, L inframammory x 3, R inguinal groin x 1 (9): Erythematous keratotic or waxy stuck-on papule.    Assessment & Plan    Lentigines - Scattered pink/tan macules face, nose - Discussed due to sun exposure - Benign, observe, rec. daily spf - Call for any changes  Seborrheic Keratoses - Stuck-on, waxy, tan-brown papules and plaques  - Discussed benign etiology and prognosis. - Observe - Call for any changes  Inflamed seborrheic keratosis (9) L mid back x 2, R inframammory x 3, L inframammory x 3, R inguinal groin x 1  Destruction of lesion - L mid back x 2, R inframammory x 3, L inframammory x 3, R inguinal groin x 1  Destruction method: cryotherapy   Informed consent: discussed and consent obtained   Lesion destroyed using liquid nitrogen: Yes   Region frozen until ice ball extended beyond lesion: Yes   Outcome: patient tolerated procedure well with no complications   Post-procedure details: wound care instructions given    Return if symptoms worsen or fail to improve.   I, Othelia Pulling, RMA, am acting as scribe for Brendolyn Patty, MD .  Documentation: I have reviewed the above documentation for accuracy and completeness, and I agree with the above.  Brendolyn Patty MD

## 2020-01-26 NOTE — Patient Instructions (Signed)
Cryotherapy Aftercare  . Wash gently with soap and water everyday.   . Apply Vaseline and Band-Aid daily until healed.  

## 2020-06-02 ENCOUNTER — Ambulatory Visit (INDEPENDENT_AMBULATORY_CARE_PROVIDER_SITE_OTHER): Payer: Medicare Other | Admitting: Dermatology

## 2020-06-02 ENCOUNTER — Other Ambulatory Visit: Payer: Self-pay

## 2020-06-02 DIAGNOSIS — L820 Inflamed seborrheic keratosis: Secondary | ICD-10-CM | POA: Diagnosis not present

## 2020-06-02 DIAGNOSIS — L578 Other skin changes due to chronic exposure to nonionizing radiation: Secondary | ICD-10-CM | POA: Diagnosis not present

## 2020-06-02 DIAGNOSIS — L821 Other seborrheic keratosis: Secondary | ICD-10-CM | POA: Diagnosis not present

## 2020-06-02 NOTE — Progress Notes (Signed)
   Follow-Up Visit   Subjective  Heather Carter is a 64 y.o. female who presents for the following: Seborrheic Keratosis (Check irritated spots on the face,chest, back itchy and growing ).  The following portions of the chart were reviewed this encounter and updated as appropriate:  Tobacco  Allergies  Meds  Problems  Med Hx  Surg Hx  Fam Hx     Review of Systems:  No other skin or systemic complaints except as noted in HPI or Assessment and Plan.  Objective  Well appearing patient in no apparent distress; mood and affect are within normal limits.  A focused examination was performed including face,chest,back . Relevant physical exam findings are noted in the Assessment and Plan.  Objective  Mid Back x 12, L arm x 1, face x 6 (19): Erythematous keratotic or waxy stuck-on papule or plaque.    Assessment & Plan  Inflamed seborrheic keratosis (19) Mid Back x 12, L arm x 1, face x 6  Destruction of lesion - Mid Back x 12, L arm x 1, face x 6 Complexity: simple   Destruction method: cryotherapy   Informed consent: discussed and consent obtained   Timeout:  patient name, date of birth, surgical site, and procedure verified Lesion destroyed using liquid nitrogen: Yes   Region frozen until ice ball extended beyond lesion: Yes   Outcome: patient tolerated procedure well with no complications   Post-procedure details: wound care instructions given    Seborrheic Keratoses - Stuck-on, waxy, tan-brown papules and plaques  - Discussed benign etiology and prognosis. - Observe - Call for any changes  Actinic Damage - diffuse scaly erythematous macules with underlying dyspigmentation - Recommend daily broad spectrum sunscreen SPF 30+ to sun-exposed areas, reapply every 2 hours as needed.  - Call for new or changing lesions.  Return if symptoms worsen or fail to improve.  IMarye Round, CMA, am acting as scribe for Sarina Ser, MD .  Documentation: I have reviewed the  above documentation for accuracy and completeness, and I agree with the above.  Sarina Ser, MD

## 2020-06-02 NOTE — Patient Instructions (Signed)
Cryotherapy Aftercare  . Wash gently with soap and water everyday.   . Apply Vaseline and Band-Aid daily until healed.  

## 2020-06-07 ENCOUNTER — Encounter: Payer: Self-pay | Admitting: Dermatology

## 2021-05-30 ENCOUNTER — Other Ambulatory Visit
Admission: RE | Admit: 2021-05-30 | Discharge: 2021-05-30 | Disposition: A | Discharge status: Home / Self Care | Payer: Medicare Other | Source: Ambulatory Visit | Attending: Internal Medicine | Admitting: Internal Medicine

## 2021-05-30 DIAGNOSIS — I208 Other forms of angina pectoris: Secondary | ICD-10-CM | POA: Insufficient documentation

## 2021-05-30 DIAGNOSIS — R0602 Shortness of breath: Secondary | ICD-10-CM | POA: Insufficient documentation

## 2021-05-30 DIAGNOSIS — I10 Essential (primary) hypertension: Secondary | ICD-10-CM | POA: Diagnosis present

## 2021-05-30 DIAGNOSIS — Z79899 Other long term (current) drug therapy: Secondary | ICD-10-CM | POA: Insufficient documentation

## 2021-05-30 DIAGNOSIS — Z01818 Encounter for other preprocedural examination: Secondary | ICD-10-CM | POA: Insufficient documentation

## 2021-05-30 LAB — BRAIN NATRIURETIC PEPTIDE: B Natriuretic Peptide: 8 pg/mL (ref 0.0–100.0)

## 2021-06-17 ENCOUNTER — Encounter: Admission: RE | Payer: Self-pay | Source: Home / Self Care

## 2021-06-17 ENCOUNTER — Ambulatory Visit: Admission: RE | Admit: 2021-06-17 | Payer: Medicare Other | Source: Home / Self Care | Admitting: Internal Medicine

## 2021-06-17 DIAGNOSIS — I208 Other forms of angina pectoris: Secondary | ICD-10-CM

## 2021-06-17 SURGERY — LEFT HEART CATH AND CORONARY ANGIOGRAPHY
Anesthesia: Moderate Sedation | Laterality: Left

## 2021-07-19 ENCOUNTER — Other Ambulatory Visit: Payer: Self-pay | Admitting: Internal Medicine

## 2021-07-19 DIAGNOSIS — I2510 Atherosclerotic heart disease of native coronary artery without angina pectoris: Secondary | ICD-10-CM

## 2021-07-19 DIAGNOSIS — I2089 Other forms of angina pectoris: Secondary | ICD-10-CM

## 2021-07-19 DIAGNOSIS — I208 Other forms of angina pectoris: Secondary | ICD-10-CM

## 2021-07-26 ENCOUNTER — Telehealth (HOSPITAL_COMMUNITY): Payer: Self-pay | Admitting: *Deleted

## 2021-07-26 NOTE — Telephone Encounter (Signed)
Attempted to call patient regarding upcoming cardiac CT appointment. Patient's mailbox full and unable to leave a message.  Gordy Clement RN Navigator Cardiac Imaging [facility] [phone] Office [phone] Cell

## 2021-07-27 ENCOUNTER — Telehealth (HOSPITAL_COMMUNITY): Payer: Self-pay | Admitting: *Deleted

## 2021-07-27 NOTE — Telephone Encounter (Signed)
Patient returning call regarding upcoming cardiac imaging study; pt verbalizes understanding of appt date/time, parking situation and where to check in, pre-test NPO status, and verified current allergies; name and call back number provided for further questions should they arise  Gordy Clement RN Navigator Cardiac Imaging [place] [facility] [phone] office [phone] cell  Patient took her blood pressure over the phone and got a reading of 126/77 with a HR of 69. Patient asked to take 50mg  atenolol two hours prior to cardiac CT scan.

## 2021-07-27 NOTE — Telephone Encounter (Signed)
Second attempt call patient regarding upcoming cardiac CT appointment. Voicemail box full and unable to leave a message.  Gordy Clement RN Navigator Cardiac Imaging [facility] Services [phone] Office [phone] Cell

## 2021-07-28 ENCOUNTER — Other Ambulatory Visit: Payer: Self-pay

## 2021-07-28 ENCOUNTER — Ambulatory Visit
Admission: RE | Admit: 2021-07-28 | Discharge: 2021-07-28 | Disposition: A | Discharge status: Home / Self Care | Payer: Medicare Other | Source: Ambulatory Visit | Attending: Internal Medicine | Admitting: Internal Medicine

## 2021-07-28 ENCOUNTER — Other Ambulatory Visit (HOSPITAL_COMMUNITY): Payer: Self-pay | Admitting: Emergency Medicine

## 2021-07-28 DIAGNOSIS — I208 Other forms of angina pectoris: Secondary | ICD-10-CM | POA: Diagnosis not present

## 2021-07-28 DIAGNOSIS — I2510 Atherosclerotic heart disease of native coronary artery without angina pectoris: Secondary | ICD-10-CM | POA: Diagnosis present

## 2021-07-28 LAB — POCT I-STAT CREATININE: Creatinine, Ser: 1.3 mg/dL — ABNORMAL HIGH (ref 0.44–1.00)

## 2021-07-28 MED ORDER — NITROGLYCERIN 0.4 MG SL SUBL
0.8000 mg | SUBLINGUAL_TABLET | Freq: Once | SUBLINGUAL | Status: AC
  Administered 2021-07-28: 0.8 mg via SUBLINGUAL

## 2021-07-28 MED ORDER — IOHEXOL 350 MG/ML SOLN
75.0000 mL | Freq: Once | INTRAVENOUS | Status: AC | PRN
  Administered 2021-07-28: 75 mL via INTRAVENOUS

## 2021-07-28 MED ORDER — IVABRADINE HCL 5 MG PO TABS: 10.0000 mg | ORAL_TABLET | Freq: Once | ORAL | 0 refills | Status: AC

## 2021-07-28 MED ORDER — METOPROLOL TARTRATE 100 MG PO TABS: 100.0000 mg | ORAL_TABLET | Freq: Once | ORAL | 0 refills | Status: DC

## 2021-07-28 MED ORDER — METOPROLOL TARTRATE 5 MG/5ML IV SOLN
10.0000 mg | Freq: Once | INTRAVENOUS | Status: AC
  Administered 2021-07-28: 10 mg via INTRAVENOUS

## 2021-07-28 NOTE — Progress Notes (Signed)
Patient given one dose of Metoprolol to lower HR. HR elevated with nitro and laying flat. Patient c/o sore throat and drainage. Dr. Mylo Red notified of BP of 1 dose of medication. Procedure cancelled and rescheduled for Monday. Nurse navigator to call and send medications. Patient aware to call Monday a.m. if she feels sick. Ambulate w/o difficulty. No further needs. Husband driving patient.

## 2021-07-29 ENCOUNTER — Other Ambulatory Visit (HOSPITAL_COMMUNITY): Payer: Self-pay | Admitting: *Deleted

## 2021-07-29 ENCOUNTER — Telehealth (HOSPITAL_COMMUNITY): Payer: Self-pay | Admitting: *Deleted

## 2021-07-29 DIAGNOSIS — I208 Other forms of angina pectoris: Secondary | ICD-10-CM

## 2021-07-29 MED ORDER — IVABRADINE HCL 5 MG PO TABS: ORAL_TABLET | ORAL | 0 refills | Status: DC

## 2021-07-29 MED ORDER — METOPROLOL TARTRATE 100 MG PO TABS: 100.0000 mg | ORAL_TABLET | Freq: Once | ORAL | 0 refills | Status: DC

## 2021-07-29 NOTE — Telephone Encounter (Signed)
Reaching out to patient to offer assistance regarding upcoming cardiac imaging study; pt verbalizes understanding of appt date/time, parking situation and where to check in, pre-test NPO status and medications ordered, and verified current allergies; name and call back number provided for further questions should they arise  Gordy Clement RN [place] [facility] [phone] office [phone] cell  Patient states her pharmacy was closed today so pt's medications sent to the [facility] in  [place] per her request. She is to hold her atenolol and take 100mg  metoprolol tartrate and 10mg  ivabradine two hours prior to cardiac CT scan.

## 2021-08-01 ENCOUNTER — Other Ambulatory Visit: Payer: Self-pay

## 2021-08-01 ENCOUNTER — Ambulatory Visit
Admission: RE | Admit: 2021-08-01 | Discharge: 2021-08-01 | Disposition: A | Discharge status: Home / Self Care | Payer: Medicare Other | Source: Ambulatory Visit | Attending: Internal Medicine | Admitting: Internal Medicine

## 2021-08-01 DIAGNOSIS — I208 Other forms of angina pectoris: Secondary | ICD-10-CM | POA: Insufficient documentation

## 2021-08-01 DIAGNOSIS — I2510 Atherosclerotic heart disease of native coronary artery without angina pectoris: Secondary | ICD-10-CM | POA: Diagnosis present

## 2021-08-01 MED ORDER — NITROGLYCERIN 0.4 MG SL SUBL
0.8000 mg | SUBLINGUAL_TABLET | Freq: Once | SUBLINGUAL | Status: AC
  Administered 2021-08-01: 15:00:00 0.8 mg via SUBLINGUAL

## 2021-08-01 MED ORDER — IOHEXOL 350 MG/ML SOLN
75.0000 mL | Freq: Once | INTRAVENOUS | Status: AC | PRN
  Administered 2021-08-01: 15:00:00 75 mL via INTRAVENOUS

## 2021-08-01 MED ORDER — METOPROLOL TARTRATE 5 MG/5ML IV SOLN: 10.0000 mg | Freq: Once | INTRAVENOUS | Status: DC

## 2021-08-01 NOTE — Progress Notes (Signed)
Patient tolerated procedure well. Ambulate w/o difficulty. Denies light headedness or being dizzy. Sitting in chair drinking water provided. Encouraged to drink extra water today and reasoning explained. Verbalized understanding. All questions answered. ABC intact. No further needs. Discharge from procedure area w/o issues.   °

## 2021-09-29 ENCOUNTER — Other Ambulatory Visit (HOSPITAL_COMMUNITY): Payer: Self-pay | Admitting: Family Medicine

## 2021-09-29 DIAGNOSIS — Z1231 Encounter for screening mammogram for malignant neoplasm of breast: Secondary | ICD-10-CM

## 2021-10-17 ENCOUNTER — Encounter: Payer: Self-pay | Admitting: Primary Care

## 2021-10-17 ENCOUNTER — Ambulatory Visit (HOSPITAL_COMMUNITY): Payer: Medicare Other

## 2021-10-19 ENCOUNTER — Encounter: Payer: Self-pay | Admitting: *Deleted

## 2021-10-20 ENCOUNTER — Ambulatory Visit (HOSPITAL_COMMUNITY)
Admission: RE | Admit: 2021-10-20 | Discharge: 2021-10-20 | Disposition: A | Discharge status: Home / Self Care | Payer: Medicare Other | Source: Ambulatory Visit | Attending: Family Medicine | Admitting: Family Medicine

## 2021-10-20 ENCOUNTER — Other Ambulatory Visit: Payer: Self-pay

## 2021-10-20 DIAGNOSIS — Z1231 Encounter for screening mammogram for malignant neoplasm of breast: Secondary | ICD-10-CM | POA: Diagnosis not present

## 2021-10-26 ENCOUNTER — Other Ambulatory Visit: Payer: Self-pay

## 2021-10-26 ENCOUNTER — Telehealth: Payer: Self-pay | Admitting: Internal Medicine

## 2021-10-26 ENCOUNTER — Inpatient Hospital Stay: Payer: Medicare Other | Attending: Internal Medicine | Admitting: Internal Medicine

## 2021-10-26 ENCOUNTER — Ambulatory Visit
Admission: RE | Admit: 2021-10-26 | Discharge: 2021-10-26 | Disposition: A | Discharge status: Home / Self Care | Payer: Self-pay | Source: Ambulatory Visit | Attending: Internal Medicine | Admitting: Internal Medicine

## 2021-10-26 ENCOUNTER — Inpatient Hospital Stay: Payer: Medicare Other

## 2021-10-26 ENCOUNTER — Encounter: Payer: Self-pay | Admitting: Internal Medicine

## 2021-10-26 DIAGNOSIS — E278 Other specified disorders of adrenal gland: Secondary | ICD-10-CM | POA: Diagnosis present

## 2021-10-26 DIAGNOSIS — Z87891 Personal history of nicotine dependence: Secondary | ICD-10-CM | POA: Diagnosis not present

## 2021-10-26 DIAGNOSIS — R918 Other nonspecific abnormal finding of lung field: Secondary | ICD-10-CM | POA: Diagnosis not present

## 2021-10-26 DIAGNOSIS — K769 Liver disease, unspecified: Secondary | ICD-10-CM | POA: Diagnosis not present

## 2021-10-26 DIAGNOSIS — K7689 Other specified diseases of liver: Secondary | ICD-10-CM | POA: Insufficient documentation

## 2021-10-26 LAB — CBC WITH DIFFERENTIAL/PLATELET
Abs Immature Granulocytes: 0.02 10*3/uL (ref 0.00–0.07)
Basophils Absolute: 0.1 10*3/uL (ref 0.0–0.1)
Basophils Relative: 1 %
Eosinophils Absolute: 0.2 10*3/uL (ref 0.0–0.5)
Eosinophils Relative: 2 %
HCT: 40.6 % (ref 36.0–46.0)
Hemoglobin: 12.4 g/dL (ref 12.0–15.0)
Immature Granulocytes: 0 %
Lymphocytes Relative: 24 %
Lymphs Abs: 1.9 10*3/uL (ref 0.7–4.0)
MCH: 24.7 pg — ABNORMAL LOW (ref 26.0–34.0)
MCHC: 30.5 g/dL (ref 30.0–36.0)
MCV: 80.9 fL (ref 80.0–100.0)
Monocytes Absolute: 0.6 10*3/uL (ref 0.1–1.0)
Monocytes Relative: 7 %
Neutro Abs: 5.1 10*3/uL (ref 1.7–7.7)
Neutrophils Relative %: 66 %
Platelets: 274 10*3/uL (ref 150–400)
RBC: 5.02 MIL/uL (ref 3.87–5.11)
RDW: 13.7 % (ref 11.5–15.5)
WBC: 7.8 10*3/uL (ref 4.0–10.5)
nRBC: 0 % (ref 0.0–0.2)

## 2021-10-26 LAB — COMPREHENSIVE METABOLIC PANEL
ALT: 19 U/L (ref 0–44)
AST: 24 U/L (ref 15–41)
Albumin: 4.7 g/dL (ref 3.5–5.0)
Alkaline Phosphatase: 81 U/L (ref 38–126)
Anion gap: 10 (ref 5–15)
BUN: 32 mg/dL — ABNORMAL HIGH (ref 8–23)
CO2: 24 mmol/L (ref 22–32)
Calcium: 10.2 mg/dL (ref 8.9–10.3)
Chloride: 99 mmol/L (ref 98–111)
Creatinine, Ser: 1.29 mg/dL — ABNORMAL HIGH (ref 0.44–1.00)
GFR, Estimated: 45 mL/min — ABNORMAL LOW (ref >60)
Glucose, Bld: 235 mg/dL — ABNORMAL HIGH (ref 70–99)
Potassium: 4.1 mmol/L (ref 3.5–5.1)
Sodium: 133 mmol/L — ABNORMAL LOW (ref 135–145)
Total Bilirubin: 0.5 mg/dL (ref 0.3–1.2)
Total Protein: 8.5 g/dL — ABNORMAL HIGH (ref 6.5–8.1)

## 2021-10-26 LAB — LACTATE DEHYDROGENASE: LDH: 90 U/L — ABNORMAL LOW (ref 98–192)

## 2021-10-26 NOTE — Progress Notes (Signed)
[facility] ?CONSULT NOTE ? ?Patient Care Team: ?Aura Dials, MD as PCP - General (Internal Medicine) ?Cammie Sickle, MD as Consulting Physician (Hematology and Oncology) ?Yolonda Kida, MD as Consulting Physician (Cardiology) ? ?CHIEF COMPLAINTS/PURPOSE OF CONSULTATION: adrenal mass ? ?# Liver mass: DEC 2022-  Indistinct hypodense 1.1 cm segment 7 right liver dome lesion, not definitely seen on 2011 CT abdomen study. MRI abdomen without and with IV contrast recommended for further characterization.FEB 2023-MRI- ?Avidly enhancing nodule with restricted diffusion adjacent to the right adrenal gland measuring up to 2.6 cm. The lesion closely abuts the adrenal gland and adrenal versus extra adrenal origin is difficult to determine. Differential is broad and includes hypervascular neoplasm such as a pheochromocytoma or neuroendocrine tumor. Recommend correlation with prior imaging if available. Consider tissue sampling if the lesion is amenable to biopsy.  ? ?The lesion in question in hepatic segment 7 represents a hepatic hemangioma measuring 1.3 cm.  ? ?Large hiatal hernia. ? ?#CT scan/cardiac- DEC 2022- . Clustered tiny solid medial basilar left lower lobe pulmonary nodules, largest 3 mm. No follow-up needed if patient is low-risk (and has no known or suspected primary neoplasm). Non-contrast chest ?CT can be considered in 12 months if patient is high-risk.4. Aortic Atherosclerosis (ICD10-I70.0). ? ?#  ?  ? ?Oncology History  ? No history exists.  ? ? ? ?HISTORY OF PRESENTING ILLNESS: Accompanied by friend.  Anxious. ? ?Heather Carter 66 y.o.  femalehas been referred to [place] for further evaluation/management of  adrenal mass noted on imaging. ? ?Patient had intermittent chest pain shortness of breath which led to further evaluation with cardiology.  CTA cardiac scan noncontrast showed-incidental bilateral lung nodules [subcentimeter]; noted to have a segment 7 hypodense lesion.  This  led to further work-up with an MRI at [facility] February 2023 that showed-liver lesion suggestive of hemangioma.  However incidentally noted to have enhancing right adrenal nodule measuring up to 2.6 cm-hypervascular concerning for malignancy. ? ?Patient is here for further recommendations/work-up.  ? ?Patient has had weight loss- intentional [[place]].  Otherwise denies any palpitations.  Denies any headaches.  No smoking.  Otherwise no nausea no vomiting.  Patient states her diabetes is not well controlled.  Complains of neuropathy. ? ?Review of Systems  ?Constitutional:  Positive for malaise/fatigue and weight loss (intentional). Negative for chills, diaphoresis and fever.  ?HENT:  Negative for nosebleeds and sore throat.   ?Eyes:  Negative for double vision.  ?Respiratory:  Positive for cough. Negative for hemoptysis, sputum production, shortness of breath and wheezing.   ?Cardiovascular:  Negative for chest pain, orthopnea and leg swelling.  ?Gastrointestinal:  Negative for abdominal pain, blood in stool, constipation, diarrhea, heartburn, melena, nausea and vomiting.  ?Genitourinary:  Negative for dysuria, frequency and urgency.  ?Musculoskeletal:  Positive for back pain and joint pain.  ?Skin: Negative.  Negative for itching and rash.  ?Neurological:  Negative for dizziness, tingling, focal weakness, weakness and headaches.  ?Endo/Heme/Allergies:  Does not bruise/bleed easily.  ?Psychiatric/Behavioral:  Negative for depression. The patient is not nervous/anxious and does not have insomnia.    ? ?MEDICAL HISTORY:  ?Past Medical History:  ?Diagnosis Date  ? Arthritis   ? Asthma   ? Depression   ? Diabetes mellitus without complication ([place])   ? GERD (gastroesophageal reflux disease)   ? High cholesterol   ? Hypertension   ? Liver lesion   ? ? ?SURGICAL HISTORY: ?Past Surgical History:  ?Procedure Laterality Date  ? ABDOMINAL  HYSTERECTOMY    ? CHOLECYSTECTOMY    ? COLONOSCOPY WITH PROPOFOL N/A 07/18/2019  ? Procedure:  COLONOSCOPY WITH PROPOFOL;  Surgeon: Lin Landsman, MD;  Location: [facility] ENDOSCOPY;  Service: Gastroenterology;  Laterality: N/A;  ? ESOPHAGOGASTRODUODENOSCOPY (EGD) WITH PROPOFOL N/A 07/18/2019  ? Procedure: ESOPHAGOGASTRODUODENOSCOPY (EGD) WITH PROPOFOL;  Surgeon: Lin Landsman, MD;  Location: [facility] ENDOSCOPY;  Service: Gastroenterology;  Laterality: N/A;  ? LAPAROSCOPIC APPENDECTOMY N/A 08/19/2019  ? Procedure: APPENDECTOMY LAPAROSCOPIC;  Surgeon: Olean Ree, MD;  Location: [facility] ORS;  Service: General;  Laterality: N/A;  ? ? ?SOCIAL HISTORY: ?Social History  ? ?Socioeconomic History  ? Marital status: Married  ?  Spouse name: Not on file  ? Number of children: Not on file  ? Years of education: Not on file  ? Highest education level: Not on file  ?Occupational History  ? Not on file  ?Tobacco Use  ? Smoking status: Former  ? Smokeless tobacco: Never  ?Vaping Use  ? Vaping Use: Never used  ?Substance and Sexual Activity  ? Alcohol use: No  ? Drug use: No  ? Sexual activity: Not Currently  ?Other Topics Concern  ? Not on file  ?Social History Narrative  ? Not on file  ? ?Social Determinants of Health  ? ?Financial Resource Strain: Not on file  ?Food Insecurity: Not on file  ?Transportation Needs: Not on file  ?Physical Activity: Not on file  ?Stress: Not on file  ?Social Connections: Not on file  ?Intimate Partner Violence: Not on file  ? ? ?FAMILY HISTORY: ?Family History  ?Problem Relation Age of Onset  ? Heart disease Mother   ? Heart disease Father   ? Stomach cancer Brother   ? ? ?ALLERGIES:  is allergic to codeine. ? ?MEDICATIONS:  ?Current Outpatient Medications  ?Medication Sig Dispense Refill  ? acetaminophen (TYLENOL) 650 MG CR tablet Take 650-1,300 mg by mouth every 8 (eight) hours as needed for pain.    ? albuterol (VENTOLIN HFA) 108 (90 Base) MCG/ACT inhaler Inhale 1-2 puffs into the lungs every 6 (six) hours as needed for wheezing or shortness of breath.     ? albuterol (VENTOLIN HFA) 108  (90 Base) MCG/ACT inhaler Inhale into the lungs every 6 (six) hours as needed for wheezing or shortness of breath.    ? Aspirin-Salicylamide-Caffeine (BC HEADACHE POWDER PO) Take 1 packet by mouth daily as needed (pain.).    ? atenolol (TENORMIN) 25 MG tablet Take 25 mg by mouth daily.    ? cyclobenzaprine (FLEXERIL) 10 MG tablet Take 1 tablet (10 mg total) by mouth 2 (two) times daily as needed for muscle spasms. 20 tablet 0  ? enalapril (VASOTEC) 10 MG tablet Take 10 mg by mouth daily.    ? GLIPIZIDE XL 10 MG 24 hr tablet Take 10 mg by mouth daily.    ? JARDIANCE 25 MG TABS tablet Take 25 mg by mouth daily.    ? metFORMIN (GLUCOPHAGE-XR) 500 MG 24 hr tablet Take 1,000 mg by mouth 2 (two) times daily.    ? omeprazole (PRILOSEC) 20 MG capsule Take 20 mg by mouth daily.    ? ondansetron (ZOFRAN) 4 MG tablet Take 1 tablet (4 mg total) by mouth every 8 (eight) hours as needed for nausea or vomiting. 20 tablet 0  ? rosuvastatin (CRESTOR) 10 MG tablet Take 10 mg by mouth at bedtime.    ? RYBELSUS 7 MG TABS Take 3 mg by mouth daily.    ? sertraline (ZOLOFT)  100 MG tablet Take 100 mg by mouth at bedtime.     ? triamterene-hydrochlorothiazide (MAXZIDE) 75-50 MG tablet Take 1 tablet by mouth daily.    ? zolpidem (AMBIEN) 5 MG tablet Take 5 mg by mouth at bedtime as needed for sleep.    ? aspirin EC 81 MG tablet Take 81-325 mg by mouth daily. Swallow whole. (Patient not taking: Reported on 10/26/2021)    ? metoprolol tartrate (LOPRESSOR) 100 MG tablet Take 1 tablet (100 mg total) by mouth once for 1 dose. Please take one time dose '100mg'$  metoprolol tartrate 2 hr prior to cardiac CT for HR control IF HR >55bpm. 1 tablet 0  ? ?No current facility-administered medications for this visit.  ? ? ?PHYSICAL EXAMINATION: ? ?Vitals:  ? 10/26/21 1127  ?BP: (!) 145/70  ?Pulse: 65  ?Temp: 98.1 ?F (36.7 ?C)  ?SpO2: 100%  ? ?Filed Weights  ? 10/26/21 1127  ?Weight: 151 lb 12.8 oz (68.9 kg)  ? ? ?Physical Exam ?Vitals and nursing note  reviewed.  ?HENT:  ?   Head: Normocephalic and atraumatic.  ?   Mouth/Throat:  ?   Pharynx: Oropharynx is clear.  ?Eyes:  ?   Extraocular Movements: Extraocular movements intact.  ?   Pupils: Pupils are equal,

## 2021-10-26 NOTE — Assessment & Plan Note (Addendum)
#  Incidental CT cardiac--1.1 cm/liver lesion dome; FEB 2023-MRI-[[facility]]-Avidly enhancing nodule with restricted diffusion adjacent to the right adrenal gland measuring up to 2.6 cm. The lesion closely abuts the adrenal gland and adrenal versus extra adrenal origin is difficult to determine. Differential is broad and includes hypervascular neoplasm such as a pheochromocytoma or neuroendocrine tumor. Recommend correlation with prior imaging if available. Consider tissue sampling if the lesion is amenable to biopsy.  ? ?#Discussed the differential includes pheochromocytoma versus neuroendocrine malignancy versus others.  Recommend endocrine work-up for pheochromocytoma including-24-hour catecholamines/metanephrines in the urine; also plasma metanephrines.  Also check CBC CMP.  We will review imaging at the tumor conference-for consideration of biopsy ? ?#Bilateral lung nodules [50m nodules]-continue surveillance imaging/12 months as per radiology/PCP ? ?#Fatigue-multifactorial diabetes neuropathy etc-unlikely from adrenal lesion. ? ?Thank you Dr.Stoneking for allowing me to participate in the care of your pleasant patient. Please do not hesitate to contact me with questions or concerns in the interim.  ? ?[facility]23.   ?# DISPOSITION: ?# labs today ?# follow up in 2-3 weeks; MD; no labs-Dr.B ? ?Thank you Dr.StoneKing for allowing me to participate in the care of your pleasant patient. Please do not hesitate to contact me with questions or concerns in the interim. ? 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 Heather- please inform pt/and make a referral. GB ?

## 2021-10-26 NOTE — Telephone Encounter (Signed)
I spoke to patient regarding results of the blood work; stage III CKD-patient aware. ? ?Patient will come back to the clinic-for second test - 24-hour urine collection.  ? ?When she comes back for the test-please have her have the blood test that is ordered.  ? ?Also please make a referral to endocrinology ASAP-dx: Pheochromocytoma/adrenal mass.  Patient aware of referral. ? ?Thanks ? ? ? ?

## 2021-10-27 ENCOUNTER — Inpatient Hospital Stay: Payer: Medicare Other

## 2021-10-27 ENCOUNTER — Other Ambulatory Visit: Payer: Self-pay

## 2021-10-27 DIAGNOSIS — E278 Other specified disorders of adrenal gland: Secondary | ICD-10-CM | POA: Diagnosis not present

## 2021-10-27 NOTE — Telephone Encounter (Signed)
She is coming at 3:30 ?

## 2021-10-27 NOTE — Telephone Encounter (Signed)
C-please sch labs. ? ?Referral placed. ?

## 2021-10-27 NOTE — Telephone Encounter (Signed)
When is coming back for me to put in lab for her do we know? ? ?

## 2021-10-27 NOTE — Telephone Encounter (Signed)
Noted  

## 2021-10-28 ENCOUNTER — Other Ambulatory Visit: Payer: Self-pay

## 2021-10-28 ENCOUNTER — Telehealth: Payer: Self-pay | Admitting: Internal Medicine

## 2021-10-28 DIAGNOSIS — E278 Other specified disorders of adrenal gland: Secondary | ICD-10-CM

## 2021-10-28 NOTE — Telephone Encounter (Signed)
Returned patient's call and she was asking if there were any abnormalities on the resulted labs that would indicate she has cancer.  She understands she will be seeing Dr. Janese Banks in Dr. Sharmaine Base absence but doesn't want to wait that long to ease her mind.   ? ?Also started collecting the 24 hr urine sample yesterday but she almost has the jug full because she has been drinking lots of water.  The lab says that she will need to keep it on ice or somewhere cool or they could give her another jug to recollect.  Patient are advised to start collecting in the mornings and not on a Friday or weekend so the jug can be brought back to the office in sufficient time. ? ?Do you want her to recollect the 24h urine or can she return the jug she started last night at 6pm today? ? ?

## 2021-10-28 NOTE — Telephone Encounter (Signed)
Called to give f/u appt to pt she will see Janese Banks in your absence but pt stated she would like you to call her today if possible ?

## 2021-10-30 LAB — METANEPHRINES, PLASMA
Metanephrine, Free: 45.4 pg/mL (ref 0.0–88.0)
Normetanephrine, Free: 555.7 pg/mL — ABNORMAL HIGH (ref 0.0–285.2)

## 2021-11-01 LAB — CATECHOLAMINES,UR.,FREE,24 HR
Dopamine, Rand Ur: 58 ug/L
Dopamine, Ur, 24Hr: 133 ug/24 hr (ref 0–510)
Epinephrine, Rand Ur: 1 ug/L
Epinephrine, U, 24Hr: 2 ug/24 hr (ref 0–20)
Norepinephrine, Rand Ur: 26 ug/L
Norepinephrine,U,24H: 60 ug/24 hr (ref 0–135)
Total Volume: 2300

## 2021-11-02 LAB — METANEPHRINES, URINE, 24 HOUR
Metaneph Total, Ur: 46 ug/L
Metanephrines, 24H Ur: 106 ug/24 hr (ref 36–209)
Normetanephrine, 24H Ur: 794 ug/24 hr — ABNORMAL HIGH (ref 131–612)
Normetanephrine, Ur: 345 ug/L
Total Volume: 2300

## 2021-11-03 ENCOUNTER — Other Ambulatory Visit: Payer: Medicare Other

## 2021-11-03 ENCOUNTER — Other Ambulatory Visit: Payer: Self-pay

## 2021-11-03 DIAGNOSIS — E278 Other specified disorders of adrenal gland: Secondary | ICD-10-CM

## 2021-11-03 NOTE — Progress Notes (Signed)
Tumor Board Documentation ? ?Heather Carter was presented by Dr Janese Banks at our Tumor Board on 11/03/2021, which included representatives from pathology, radiology, medical oncology, surgical, pharmacy, pulmonology, genetics, radiation oncology, navigation, research, internal medicine, palliative care. ? ?Heather Carter currently presents as a current patient, for discussion with history of the following treatments: active survellience. ? ?Additionally, we reviewed previous medical and familial history, history of present illness, and recent lab results along with all available histopathologic and imaging studies. The tumor board considered available treatment options and made the following recommendations: ?  ?Refer to Endocrinology ? ?The following procedures/referrals were also placed: No orders of the defined types were placed in this encounter. ? ? ?Clinical Trial Status: not discussed  ? ?Staging used:   ?AJCC Staging: ?  ?  ?  ?Group: Possible Pheochromocytoma of [place] ? ? ?National site-specific guidelines   were discussed with respect to the case. ? ?Tumor board is a meeting of clinicians from various specialty areas who evaluate and discuss patients for whom a multidisciplinary approach is being considered. Final determinations in the plan of care are those of the provider(s). The responsibility for follow up of recommendations given during tumor board is that of the provider.  ? ?Today?s extended care, comprehensive team conference, Brinlee was not present for the discussion and was not examined.  ? ?Multidisciplinary Tumor Board is a multidisciplinary case peer review process.  Decisions discussed in the Multidisciplinary Tumor Board reflect the opinions of the specialists present at the conference without having examined the patient.  Ultimately, treatment and diagnostic decisions rest with the primary provider(s) and the patient. ? ?

## 2021-11-08 ENCOUNTER — Telehealth: Payer: Self-pay

## 2021-11-08 ENCOUNTER — Other Ambulatory Visit: Payer: Self-pay

## 2021-11-08 DIAGNOSIS — E278 Other specified disorders of adrenal gland: Secondary | ICD-10-CM

## 2021-11-08 NOTE — Telephone Encounter (Signed)
Larene Beach from [facility] Endocrinology returned the call stating the provider had reviewed the case and she was not advised to schedule the pt any sooner than later. At the moment they do not have the capability to accomodaote STAT referrals at the time. She suggested that it would be beneficial for the pt to be seen elsewhere at a sooner time. Thanked for returning my call and will inform Dr. Janese Banks of suggestion.  ? ?[facility] Endo also has a waiting time of late July  early August for NP appointments ?

## 2021-11-09 ENCOUNTER — Telehealth: Payer: Self-pay

## 2021-11-09 NOTE — Telephone Encounter (Signed)
Referral to [facility] Endo was sent to [phone] and have recieved a fax confirmation.  ?

## 2021-11-15 ENCOUNTER — Inpatient Hospital Stay: Payer: Medicare Other | Attending: Internal Medicine | Admitting: Oncology

## 2021-11-15 ENCOUNTER — Encounter: Payer: Self-pay | Admitting: Oncology

## 2021-11-15 VITALS — BP 102/71 | HR 92 | Temp 98.9°F | Resp 18 | Wt 155.8 lb

## 2021-11-15 DIAGNOSIS — E278 Other specified disorders of adrenal gland: Secondary | ICD-10-CM | POA: Diagnosis not present

## 2021-11-15 DIAGNOSIS — E279 Disorder of adrenal gland, unspecified: Secondary | ICD-10-CM | POA: Insufficient documentation

## 2021-11-15 DIAGNOSIS — M722 Plantar fascial fibromatosis: Secondary | ICD-10-CM | POA: Insufficient documentation

## 2021-11-20 NOTE — Progress Notes (Signed)
? ? ? ?Hematology/Oncology Consult note ?[place]  ?[phone] [phone] ? ?Patient Care Team: ?Aura Dials, MD as PCP - General (Internal Medicine) ?Cammie Sickle, MD as Consulting Physician (Hematology and Oncology) ?Yolonda Kida, MD as Consulting Physician (Cardiology)  ? ?Name of the patient: Heather Carter  ?[id number]  ?Feb 08, 1956  ? ?Date of visit: 11/20/21 ? ?Diagnosis- adrenal mass of unclear etiology ? ?Chief complaint/ Reason for visit- discuss tumor board consensus and further management  ?Heme/Onc history: patient is a 66 year old female who was previously seen by Dr. Lynett Fish when she was noted to have enhancing nodule adjacent to the right adrenal gland measuring 2.6 cm.  This was initially noted on CT in December 2022 followed by MRI in February 2023.FEB 2023-MRI- ?Avidly enhancing nodule with restricted diffusion adjacent to the right adrenal gland measuring up to 2.6 cm. The lesion closely abuts the adrenal gland and adrenal versus extra adrenal origin is difficult to determine. Differential is broad and includes hypervascular neoplasm such as a pheochromocytoma or neuroendocrine tumor. Recommend correlation with prior imaging if available. Consider tissue sampling if the lesion is amenable to biopsy.  ? ?Interval history-patient reports feeling nervous and anxious at times.  States that she passed out about a year ago probably due to her blood sugars.  She has not been checking her blood pressures at home but in our office Her blood pressures have been normal ? ?ECOG PS- 1 ?Pain scale- 0 ? ? ?Review of systems- Review of Systems  ?Constitutional:  Positive for malaise/fatigue. Negative for chills, fever and weight loss.  ?HENT:  Negative for congestion, ear discharge and nosebleeds.   ?Eyes:  Negative for blurred vision.  ?Respiratory:  Negative for cough, hemoptysis, sputum production, shortness of breath and wheezing.    ?Cardiovascular:  Negative for chest pain, palpitations, orthopnea and claudication.  ?Gastrointestinal:  Negative for abdominal pain, blood in stool, constipation, diarrhea, heartburn, melena, nausea and vomiting.  ?Genitourinary:  Negative for dysuria, flank pain, frequency, hematuria and urgency.  ?Musculoskeletal:  Negative for back pain, joint pain and myalgias.  ?Skin:  Negative for rash.  ?Neurological:  Negative for dizziness, tingling, focal weakness, seizures, weakness and headaches.  ?Endo/Heme/Allergies:  Does not bruise/bleed easily.  ?Psychiatric/Behavioral:  Negative for depression and suicidal ideas. The patient does not have insomnia.    ? ?Allergies  ?Allergen Reactions  ? Codeine Nausea And Vomiting  ? ? ? ?Past Medical History:  ?Diagnosis Date  ? Arthritis   ? Asthma   ? Depression   ? Diabetes mellitus without complication ([place])   ? GERD (gastroesophageal reflux disease)   ? High cholesterol   ? Hypertension   ? Liver lesion   ? ? ? ?Past Surgical History:  ?Procedure Laterality Date  ? ABDOMINAL HYSTERECTOMY    ? CHOLECYSTECTOMY    ? COLONOSCOPY WITH PROPOFOL N/A 07/18/2019  ? Procedure: COLONOSCOPY WITH PROPOFOL;  Surgeon: Lin Landsman, MD;  Location: [facility] ENDOSCOPY;  Service: Gastroenterology;  Laterality: N/A;  ? ESOPHAGOGASTRODUODENOSCOPY (EGD) WITH PROPOFOL N/A 07/18/2019  ? Procedure: ESOPHAGOGASTRODUODENOSCOPY (EGD) WITH PROPOFOL;  Surgeon: Lin Landsman, MD;  Location: [facility] ENDOSCOPY;  Service: Gastroenterology;  Laterality: N/A;  ? LAPAROSCOPIC APPENDECTOMY N/A 08/19/2019  ? Procedure: APPENDECTOMY LAPAROSCOPIC;  Surgeon: Olean Ree, MD;  Location: [facility] ORS;  Service: General;  Laterality: N/A;  ? ? ?Social History  ? ?Socioeconomic History  ? Marital status: Married  ?  Spouse name: Not on file  ? Number of  children: Not on file  ? Years of education: Not on file  ? Highest education level: Not on file  ?Occupational History  ? Not on file  ?Tobacco Use  ? Smoking  status: Former  ? Smokeless tobacco: Never  ?Vaping Use  ? Vaping Use: Never used  ?Substance and Sexual Activity  ? Alcohol use: No  ? Drug use: No  ? Sexual activity: Not Currently  ?Other Topics Concern  ? Not on file  ?Social History Narrative  ? Not on file  ? ?Social Determinants of Health  ? ?Financial Resource Strain: Not on file  ?Food Insecurity: Not on file  ?Transportation Needs: Not on file  ?Physical Activity: Not on file  ?Stress: Not on file  ?Social Connections: Not on file  ?Intimate Partner Violence: Not on file  ? ? ?Family History  ?Problem Relation Age of Onset  ? Heart disease Mother   ? Heart disease Father   ? Stomach cancer Brother   ? ? ? ?Current Outpatient Medications:  ?  acetaminophen (TYLENOL) 650 MG CR tablet, Take 650-1,300 mg by mouth every 8 (eight) hours as needed for pain., Disp: , Rfl:  ?  albuterol (VENTOLIN HFA) 108 (90 Base) MCG/ACT inhaler, Inhale 1-2 puffs into the lungs every 6 (six) hours as needed for wheezing or shortness of breath. , Disp: , Rfl:  ?  albuterol (VENTOLIN HFA) 108 (90 Base) MCG/ACT inhaler, Inhale into the lungs every 6 (six) hours as needed for wheezing or shortness of breath., Disp: , Rfl:  ?  atenolol (TENORMIN) 25 MG tablet, Take 25 mg by mouth daily., Disp: , Rfl:  ?  cyclobenzaprine (FLEXERIL) 10 MG tablet, Take 1 tablet (10 mg total) by mouth 2 (two) times daily as needed for muscle spasms., Disp: 20 tablet, Rfl: 0 ?  enalapril (VASOTEC) 10 MG tablet, Take 10 mg by mouth daily., Disp: , Rfl:  ?  GLIPIZIDE XL 10 MG 24 hr tablet, Take 10 mg by mouth daily., Disp: , Rfl:  ?  JARDIANCE 25 MG TABS tablet, Take 25 mg by mouth daily., Disp: , Rfl:  ?  metFORMIN (GLUCOPHAGE-XR) 500 MG 24 hr tablet, Take 1,000 mg by mouth 2 (two) times daily., Disp: , Rfl:  ?  omeprazole (PRILOSEC) 20 MG capsule, Take 20 mg by mouth daily., Disp: , Rfl:  ?  rosuvastatin (CRESTOR) 10 MG tablet, Take 10 mg by mouth at bedtime., Disp: , Rfl:  ?  RYBELSUS 7 MG TABS, Take 3 mg  by mouth daily., Disp: , Rfl:  ?  sertraline (ZOLOFT) 100 MG tablet, Take 100 mg by mouth at bedtime. , Disp: , Rfl:  ?  triamterene-hydrochlorothiazide (MAXZIDE) 75-50 MG tablet, Take 1 tablet by mouth daily., Disp: , Rfl:  ?  zolpidem (AMBIEN) 5 MG tablet, Take 5 mg by mouth at bedtime as needed for sleep., Disp: , Rfl:  ?  aspirin EC 81 MG tablet, Take 81-325 mg by mouth daily. Swallow whole. (Patient not taking: Reported on 10/26/2021), Disp: , Rfl:  ?  Aspirin-Salicylamide-Caffeine (BC HEADACHE POWDER PO), Take 1 packet by mouth daily as needed (pain.). (Patient not taking: Reported on 11/15/2021), Disp: , Rfl:  ?  metoprolol tartrate (LOPRESSOR) 100 MG tablet, Take 1 tablet (100 mg total) by mouth once for 1 dose. Please take one time dose '100mg'$  metoprolol tartrate 2 hr prior to cardiac CT for HR control IF HR >55bpm. (Patient not taking: Reported on 11/15/2021), Disp: 1 tablet, Rfl: 0 ?  ondansetron (ZOFRAN) 4 MG tablet, Take 1 tablet (4 mg total) by mouth every 8 (eight) hours as needed for nausea or vomiting. (Patient not taking: Reported on 11/15/2021), Disp: 20 tablet, Rfl: 0 ? ?Physical exam:  ?Vitals:  ? 11/15/21 1517  ?BP: 102/71  ?Pulse: 92  ?Resp: 18  ?Temp: 98.9 ?F (37.2 ?C)  ?SpO2: 94%  ?Weight: 155 lb 12.8 oz (70.7 kg)  ? ?Physical Exam ?Constitutional:   ?   General: She is not in acute distress. ?Cardiovascular:  ?   Rate and Rhythm: Normal rate and regular rhythm.  ?   Heart sounds: Normal heart sounds.  ?Pulmonary:  ?   Effort: Pulmonary effort is normal.  ?   Breath sounds: Normal breath sounds.  ?Abdominal:  ?   General: Bowel sounds are normal.  ?   Palpations: Abdomen is soft.  ?Skin: ?   General: Skin is warm and dry.  ?Neurological:  ?   Mental Status: She is alert and oriented to person, place, and time.  ?  ? ? ?  Latest Ref Rng & Units 10/26/2021  ? 12:38 PM  ?CMP  ?Glucose 70 - 99 mg/dL 235    ?BUN 8 - 23 mg/dL 32    ?Creatinine 0.44 - 1.00 mg/dL 1.29    ?Sodium 135 - 145 mmol/L 133     ?Potassium 3.5 - 5.1 mmol/L 4.1    ?Chloride 98 - 111 mmol/L 99    ?CO2 22 - 32 mmol/L 24    ?Calcium 8.9 - 10.3 mg/dL 10.2    ?Total Protein 6.5 - 8.1 g/dL 8.5    ?Total Bilirubin 0.3 - 1.2 mg/dL 0.5    ?Alkaline Phos 38

## 2021-11-21 ENCOUNTER — Other Ambulatory Visit: Payer: Self-pay | Admitting: *Deleted

## 2021-11-21 DIAGNOSIS — E278 Other specified disorders of adrenal gland: Secondary | ICD-10-CM

## 2021-12-19 ENCOUNTER — Ambulatory Visit
Admission: RE | Admit: 2021-12-19 | Discharge: 2021-12-19 | Disposition: A | Discharge status: Home / Self Care | Payer: Medicare Other | Source: Ambulatory Visit | Attending: Oncology | Admitting: Oncology

## 2021-12-19 DIAGNOSIS — E278 Other specified disorders of adrenal gland: Secondary | ICD-10-CM | POA: Insufficient documentation

## 2021-12-19 MED ORDER — GADOBUTROL 1 MMOL/ML IV SOLN
7.0000 mL | Freq: Once | INTRAVENOUS | Status: AC | PRN
  Administered 2021-12-19: 7 mL via INTRAVENOUS

## 2021-12-20 ENCOUNTER — Ambulatory Visit (INDEPENDENT_AMBULATORY_CARE_PROVIDER_SITE_OTHER): Payer: Medicare Other | Admitting: Internal Medicine

## 2021-12-20 ENCOUNTER — Encounter: Payer: Self-pay | Admitting: Internal Medicine

## 2021-12-20 VITALS — BP 110/72 | HR 70 | Ht 65.0 in | Wt 153.4 lb

## 2021-12-20 DIAGNOSIS — R1900 Intra-abdominal and pelvic swelling, mass and lump, unspecified site: Secondary | ICD-10-CM

## 2021-12-20 LAB — BASIC METABOLIC PANEL
BUN: 20 mg/dL (ref 6–23)
CO2: 27 mEq/L (ref 19–32)
Calcium: 10.4 mg/dL (ref 8.4–10.5)
Chloride: 95 mEq/L — ABNORMAL LOW (ref 96–112)
Creatinine, Ser: 1.32 mg/dL — ABNORMAL HIGH (ref 0.40–1.20)
GFR: 41.55 mL/min — ABNORMAL LOW (ref >60.00)
Glucose, Bld: 233 mg/dL — ABNORMAL HIGH (ref 70–99)
Potassium: 4.4 mEq/L (ref 3.5–5.1)
Sodium: 135 mEq/L (ref 135–145)

## 2021-12-20 NOTE — Patient Instructions (Signed)

## 2021-12-20 NOTE — Progress Notes (Signed)
Name: Heather Carter  MRN/ DOB: [id number], 1955-08-22    Age/ Sex: 66 y.o., female    PCP: Cedric Fishman, MD   Reason for Endocrinology Evaluation: Abdominal Mass      Date of Initial Endocrinology Evaluation: 12/20/2021     HPI: Heather Carter is a 66 y.o. female with a past medical history of HTN, T2DM,. The patient presented for initial endocrinology clinic visit on 12/20/2021 for consultative assistance with her Abdominal mass .   The patient has been referred to endocrinology for further evaluation of abdominal mass, located medial to the right adrenal gland.  Per radiology report this mass has been noted since 2011 when it was 6 mm at the time  The mass is not otherwise from the adrenal gland   Adrenal incidentaloma: Substantial weight gain- no Centripetal obesity- no Easy bruisability- no Severe hypertension- no DM- yes  Proximal muscle weakness-yes Fatigue- yes Sudden/ severe headaches- no Weight loss- intentional  Anxiety attacks- yes Sweating-  Cardiac arrhythmias- no Palpitations- yes  Fluid retention- no Hypokalemia- no   She is complaining of decreased libido No FH  adrenal disease       HISTORY:  Past Medical History:  Past Medical History:  Diagnosis Date  . Arthritis   . Asthma   . Depression   . Diabetes mellitus without complication ([facility])   . GERD (gastroesophageal reflux disease)   . High cholesterol   . Hypertension   . Liver lesion    Past Surgical History:  Past Surgical History:  Procedure Laterality Date  . ABDOMINAL HYSTERECTOMY    . CHOLECYSTECTOMY    . COLONOSCOPY WITH PROPOFOL N/A 07/18/2019   Procedure: COLONOSCOPY WITH PROPOFOL;  Surgeon: Toney Reil, MD;  Location[facility] ENDOSCOPY;  Service: Gastroenterology;  Laterality: N/A;  . ESOPHAGOGASTRODUODENOSCOPY (EGD) WITH PROPOFOL N/A 07/18/2019   Procedure: ESOPHAGOGASTRODUODENOSCOPY (EGD) WITH PROPOFOL;  Surgeon: Toney Reil, MD;  Location: [facility]  ENDOSCOPY;  Service: Gastroenterology;  Laterality: N/A;  . LAPAROSCOPIC APPENDECTOMY N/A 08/19/2019   Procedure: APPENDECTOMY LAPAROSCOPIC;  Surgeon: Henrene Dodge, MD;  Location: [facility] ORS;  Service: General;  Laterality: N/A;    Social History:  reports that she has quit smoking. She has never used smokeless tobacco. She reports that she does not drink alcohol and does not use drugs. Family History: family history includes Heart disease in her father and mother; Stomach cancer in her brother.   HOME MEDICATIONS: Allergies as of 12/20/2021       Reactions   Codeine Nausea And Vomiting        Medication List        Accurate as of Dec 20, 2021  4:47 PM. If you have any questions, ask your nurse or doctor.          STOP taking these medications    aspirin EC 81 MG tablet Stopped by: Scarlette Shorts, MD   BC HEADACHE POWDER PO Stopped by: Scarlette Shorts, MD   ondansetron 4 MG tablet Commonly known as: Zofran Stopped by: Scarlette Shorts, MD       TAKE these medications    acetaminophen 650 MG CR tablet Commonly known as: TYLENOL Take 650-1,300 mg by mouth every 8 (eight) hours as needed for pain.   albuterol 108 (90 Base) MCG/ACT inhaler Commonly known as: VENTOLIN HFA Inhale 1-2 puffs into the lungs every 6 (six) hours as needed for wheezing or shortness of breath.   albuterol 108 (90 Base) MCG/ACT  inhaler Commonly known as: VENTOLIN HFA Inhale into the lungs every 6 (six) hours as needed for wheezing or shortness of breath.   atenolol 25 MG tablet Commonly known as: TENORMIN Take 25 mg by mouth daily.   cyclobenzaprine 10 MG tablet Commonly known as: FLEXERIL Take 1 tablet (10 mg total) by mouth 2 (two) times daily as needed for muscle spasms.   enalapril 10 MG tablet Commonly known as: VASOTEC Take 10 mg by mouth daily.   glipiZIDE XL 10 MG 24 hr tablet Generic drug: glipiZIDE Take 10 mg by mouth daily. What changed: Another medication  with the same name was removed. Continue taking this medication, and follow the directions you see here. Changed by: Scarlette Shorts, MD   Jardiance 25 MG Tabs tablet Generic drug: empagliflozin Take 25 mg by mouth daily.   metFORMIN 500 MG 24 hr tablet Commonly known as: GLUCOPHAGE-XR Take 1,000 mg by mouth 2 (two) times daily. What changed: Another medication with the same name was removed. Continue taking this medication, and follow the directions you see here. Changed by: Scarlette Shorts, MD   metoprolol tartrate 100 MG tablet Commonly known as: LOPRESSOR Take 1 tablet (100 mg total) by mouth once for 1 dose. Please take one time dose 100mg  metoprolol tartrate 2 hr prior to cardiac CT for HR control IF HR >55bpm.   omeprazole 20 MG capsule Commonly known as: PRILOSEC Take 20 mg by mouth daily.   rosuvastatin 10 MG tablet Commonly known as: CRESTOR Take 10 mg by mouth at bedtime.   Rybelsus 7 MG Tabs Generic drug: Semaglutide Take 3 mg by mouth daily.   sertraline 100 MG tablet Commonly known as: ZOLOFT Take 100 mg by mouth at bedtime.   spironolactone 50 MG tablet Commonly known as: ALDACTONE Take 50 mg by mouth daily.   triamterene-hydrochlorothiazide 75-50 MG tablet Commonly known as: MAXZIDE Take 1 tablet by mouth daily.   zolpidem 5 MG tablet Commonly known as: AMBIEN Take 5 mg by mouth at bedtime as needed for sleep.          REVIEW OF SYSTEMS: A comprehensive ROS was conducted with the patient and is negative except as per HPI     OBJECTIVE:  VS: BP 110/72 (BP Location: Left Arm, Patient Position: Sitting, Cuff Size: Small)   Pulse 70   Ht 5\' 5"  (1.651 m)   Wt 153 lb 6.4 oz (69.6 kg)   SpO2 96%   BMI 25.53 kg/m    Wt Readings from Last 3 Encounters:  12/20/21 153 lb 6.4 oz (69.6 kg)  11/15/21 155 lb 12.8 oz (70.7 kg)  10/26/21 151 lb 12.8 oz (68.9 kg)     EXAM: General: Pt appears well and is in NAD  Neck: General: Supple  without adenopathy. Thyroid: Thyroid size normal.  No goiter or nodules appreciated.   Lungs: Clear with good BS bilat with no rales, rhonchi, or wheezes  Heart: Auscultation: RRR.  Abdomen: Normoactive bowel sounds, soft, nontender, without masses or organomegaly palpable  Extremities:  BL LE: No pretibial edema normal ROM and strength.  Mental Status: Judgment, insight: Intact Orientation: Oriented to time, place, and person Mood and affect: No depression, anxiety, or agitation     DATA REVIEWED:  Latest Reference Range & Units 12/20/21 09:45  Sodium 135 - 145 mEq/L 135  Potassium 3.5 - 5.1 mEq/L 4.4  Chloride 96 - 112 mEq/L 95 (L)  CO2 19 - 32 mEq/L 27  Glucose 70 -  99 mg/dL 981 (H)  BUN 6 - 23 mg/dL 20  Creatinine 1.91 - 4.78 mg/dL 2.95 (H)  Calcium 8.4 - 10.5 mg/dL 62.1  GFR >30.86 mL/min 41.55 (L)        CT Abdomen 12/19/2021  Adrenals/Urinary Tract: There is a well-defined solid and diffusely enhancing mass identified just medial to the right adrenal gland which measures 2 x 1.7 x 2.8 cm in AP, transverse and craniocaudal dimensions. The mass appears to abut rather than arise from the adrenal gland. There was a 6 mm enhancing nodule in this location on previous CT in 2011 which is also retrospectively visualized on a thoracic spine CT in 2018 measuring 12 x 6 mm. Adrenal glands appear otherwise normal. 2.8 cm exophytic cyst in the lower left kidney. No enhancing renal mass identified. No hydronephrosis. Urinary bladder and urethra appear within normal limits.    ASSESSMENT/PLAN/RECOMMENDATIONS:   Abdominal mass:  -Per abdominal imaging the 2.8 cm near mass appears that it abuts wider than arises from the right adrenal gland. -She had urinary metanephrines done through oncology on 10/26/2021 with an elevation of normetanephrine at 794 (reference 131-612 UG) , with normal metanephrine.  As well as elevated plasma normetanephrine at 555.7 (reference 0-285) . 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  -We will proceed with renin, aldosterone check as well as 24-hour urinary cortisol  -If her metanephrines or catecholamines are elevated we will proceed with further imaging of the lesion -If work-up comes back negative I will refer her for further evaluation by surgery    Follow-up pending lab results    Signed electronically by: Lyndle Herrlich, MD  [facility] Endocrinology  [facility] [address] [place] [place], [place] [address] Phone: [phone] FAX: [phone]   CC: Cedric Fishman, MD [address] [place] [address] Phone: [phone] Fax: [phone]   Return to Endocrinology clinic as below: Future Appointments  Date Time Provider Department Center  12/21/2021  1:15 PM Earna Coder, MD [facility] None

## 2021-12-21 ENCOUNTER — Encounter: Payer: Self-pay | Admitting: Internal Medicine

## 2021-12-21 ENCOUNTER — Inpatient Hospital Stay: Payer: Medicare Other | Attending: Internal Medicine | Admitting: Internal Medicine

## 2021-12-21 DIAGNOSIS — Z79899 Other long term (current) drug therapy: Secondary | ICD-10-CM | POA: Diagnosis not present

## 2021-12-21 DIAGNOSIS — E278 Other specified disorders of adrenal gland: Secondary | ICD-10-CM | POA: Diagnosis not present

## 2021-12-21 DIAGNOSIS — Z87891 Personal history of nicotine dependence: Secondary | ICD-10-CM | POA: Diagnosis not present

## 2021-12-21 DIAGNOSIS — R160 Hepatomegaly, not elsewhere classified: Secondary | ICD-10-CM | POA: Diagnosis not present

## 2021-12-21 DIAGNOSIS — I10 Essential (primary) hypertension: Secondary | ICD-10-CM | POA: Insufficient documentation

## 2021-12-21 DIAGNOSIS — E279 Disorder of adrenal gland, unspecified: Secondary | ICD-10-CM | POA: Insufficient documentation

## 2021-12-21 DIAGNOSIS — Z7984 Long term (current) use of oral hypoglycemic drugs: Secondary | ICD-10-CM | POA: Insufficient documentation

## 2021-12-21 DIAGNOSIS — K449 Diaphragmatic hernia without obstruction or gangrene: Secondary | ICD-10-CM | POA: Diagnosis not present

## 2021-12-21 DIAGNOSIS — R918 Other nonspecific abnormal finding of lung field: Secondary | ICD-10-CM | POA: Diagnosis not present

## 2021-12-21 DIAGNOSIS — E1140 Type 2 diabetes mellitus with diabetic neuropathy, unspecified: Secondary | ICD-10-CM | POA: Insufficient documentation

## 2021-12-21 NOTE — Progress Notes (Signed)
MRI results

## 2021-12-21 NOTE — Progress Notes (Signed)
[place] ?CONSULT NOTE ? ?Patient Care Team: ?Aura Dials, MD as PCP - General (Internal Medicine) ?Cammie Sickle, MD as Consulting Physician (Hematology and Oncology) ?Yolonda Kida, MD as Consulting Physician (Cardiology) ? ?CHIEF COMPLAINTS/PURPOSE OF CONSULTATION: adrenal mass ? ?# Liver mass: DEC 2022-  Indistinct hypodense 1.1 cm segment 7 right liver dome lesion, not definitely seen on 2011 CT abdomen study. MRI abdomen without and with IV contrast recommended for further characterization.FEB 2023-MRI- ?Avidly enhancing nodule with restricted diffusion adjacent to the right adrenal gland measuring up to 2.6 cm. The lesion closely abuts the adrenal gland and adrenal versus extra adrenal origin is difficult to determine. Differential is broad and includes hypervascular neoplasm such as a pheochromocytoma or neuroendocrine tumor. Recommend correlation with prior imaging if available. Consider tissue sampling if the lesion is amenable to biopsy.  ? ?The lesion in question in hepatic segment 7 represents a hepatic hemangioma measuring 1.3 cm.  ? ?Large hiatal hernia. ? ?#CT scan/cardiac- DEC 2022- . Clustered tiny solid medial basilar left lower lobe pulmonary nodules, largest 3 mm. No follow-up needed if patient is low-risk (and has no known or suspected primary neoplasm). Non-contrast chest ?CT can be considered in 12 months if patient is high-risk.4. Aortic Atherosclerosis (ICD10-I70.0). ? ?#  ?  ? ?Oncology History  ? No history exists.  ? ? ? ?HISTORY OF PRESENTING ILLNESS: Accompanied by friend.  Anxious.  Ambulating independently. ? ?Heather Carter 66 y.o.  female patient is here to review the results of a follow-up MRI given concerns for liver lesion//right "adrenal mass" noted on imaging. ? ?In the interim patient has been evaluated by endocrinology in [place].  She is awaiting further work-up. ? ?Patient continues to have chronic fatigue.  Not any worse.  Denies  any headaches.  No smoking.  Otherwise no nausea no vomiting.  Patient states her diabetes is not well controlled.  Complains of neuropathy. ? ?Review of Systems  ?Constitutional:  Positive for malaise/fatigue and weight loss (intentional). Negative for chills, diaphoresis and fever.  ?HENT:  Negative for nosebleeds and sore throat.   ?Eyes:  Negative for double vision.  ?Respiratory:  Positive for cough. Negative for hemoptysis, sputum production, shortness of breath and wheezing.   ?Cardiovascular:  Negative for chest pain, orthopnea and leg swelling.  ?Gastrointestinal:  Negative for abdominal pain, blood in stool, constipation, diarrhea, heartburn, melena, nausea and vomiting.  ?Genitourinary:  Negative for dysuria, frequency and urgency.  ?Musculoskeletal:  Positive for back pain and joint pain.  ?Skin: Negative.  Negative for itching and rash.  ?Neurological:  Negative for dizziness, tingling, focal weakness, weakness and headaches.  ?Endo/Heme/Allergies:  Does not bruise/bleed easily.  ?Psychiatric/Behavioral:  Negative for depression. The patient is not nervous/anxious and does not have insomnia.    ? ?MEDICAL HISTORY:  ?Past Medical History:  ?Diagnosis Date  ? Arthritis   ? Asthma   ? Depression   ? Diabetes mellitus without complication ([place])   ? GERD (gastroesophageal reflux disease)   ? High cholesterol   ? Hypertension   ? Liver lesion   ? ? ?SURGICAL HISTORY: ?Past Surgical History:  ?Procedure Laterality Date  ? ABDOMINAL HYSTERECTOMY    ? CHOLECYSTECTOMY    ? COLONOSCOPY WITH PROPOFOL N/A 07/18/2019  ? Procedure: COLONOSCOPY WITH PROPOFOL;  Surgeon: Lin Landsman, MD;  Location[facility] ENDOSCOPY;  Service: Gastroenterology;  Laterality: N/A;  ? ESOPHAGOGASTRODUODENOSCOPY (EGD) WITH PROPOFOL N/A 07/18/2019  ? Procedure: ESOPHAGOGASTRODUODENOSCOPY (EGD) WITH PROPOFOL;  Surgeon: Lin Landsman, MD;  Location: [facility] ENDOSCOPY;  Service: Gastroenterology;  Laterality: N/A;  ? LAPAROSCOPIC  APPENDECTOMY N/A 08/19/2019  ? Procedure: APPENDECTOMY LAPAROSCOPIC;  Surgeon: Olean Ree, MD;  Location: [facility] ORS;  Service: General;  Laterality: N/A;  ? ? ?SOCIAL HISTORY: ?Social History  ? ?Socioeconomic History  ? Marital status: Married  ?  Spouse name: Not on file  ? Number of children: Not on file  ? Years of education: Not on file  ? Highest education level: Not on file  ?Occupational History  ? Not on file  ?Tobacco Use  ? Smoking status: Former  ? Smokeless tobacco: Never  ?Vaping Use  ? Vaping Use: Never used  ?Substance and Sexual Activity  ? Alcohol use: No  ? Drug use: No  ? Sexual activity: Not Currently  ?Other Topics Concern  ? Not on file  ?Social History Narrative  ? Not on file  ? ?Social Determinants of Health  ? ?Financial Resource Strain: Not on file  ?Food Insecurity: Not on file  ?Transportation Needs: Not on file  ?Physical Activity: Not on file  ?Stress: Not on file  ?Social Connections: Not on file  ?Intimate Partner Violence: Not on file  ? ? ?FAMILY HISTORY: ?Family History  ?Problem Relation Age of Onset  ? Heart disease Mother   ? Heart disease Father   ? Stomach cancer Brother   ? ? ?ALLERGIES:  is allergic to codeine. ? ?MEDICATIONS:  ?Current Outpatient Medications  ?Medication Sig Dispense Refill  ? acetaminophen (TYLENOL) 650 MG CR tablet Take 650-1,300 mg by mouth every 8 (eight) hours as needed for pain.    ? albuterol (VENTOLIN HFA) 108 (90 Base) MCG/ACT inhaler Inhale 1-2 puffs into the lungs every 6 (six) hours as needed for wheezing or shortness of breath.     ? albuterol (VENTOLIN HFA) 108 (90 Base) MCG/ACT inhaler Inhale into the lungs every 6 (six) hours as needed for wheezing or shortness of breath.    ? atenolol (TENORMIN) 25 MG tablet Take 25 mg by mouth daily.    ? cyclobenzaprine (FLEXERIL) 10 MG tablet Take 1 tablet (10 mg total) by mouth 2 (two) times daily as needed for muscle spasms. 20 tablet 0  ? enalapril (VASOTEC) 10 MG tablet Take 10 mg by mouth daily.     ? GLIPIZIDE XL 10 MG 24 hr tablet Take 10 mg by mouth daily.    ? JARDIANCE 25 MG TABS tablet Take 25 mg by mouth daily.    ? metFORMIN (GLUCOPHAGE-XR) 500 MG 24 hr tablet Take 1,000 mg by mouth 2 (two) times daily.    ? omeprazole (PRILOSEC) 20 MG capsule Take 20 mg by mouth daily.    ? rosuvastatin (CRESTOR) 10 MG tablet Take 10 mg by mouth at bedtime.    ? RYBELSUS 7 MG TABS Take 3 mg by mouth daily.    ? sertraline (ZOLOFT) 100 MG tablet Take 100 mg by mouth at bedtime.     ? spironolactone (ALDACTONE) 50 MG tablet Take 50 mg by mouth daily.    ? triamterene-hydrochlorothiazide (MAXZIDE) 75-50 MG tablet Take 1 tablet by mouth daily.    ? zolpidem (AMBIEN) 5 MG tablet Take 5 mg by mouth at bedtime as needed for sleep.    ? ?No current facility-administered medications for this visit.  ? ? ?PHYSICAL EXAMINATION: ? ?Vitals:  ? 12/21/21 1304  ?BP: 124/66  ?Pulse: 76  ?Temp: 97.8 ?F (36.6 ?C)  ?SpO2: 100%  ? ?  Filed Weights  ? 12/21/21 1304  ?Weight: 155 lb 3.2 oz (70.4 kg)  ? ? ?Physical Exam ?Vitals and nursing note reviewed.  ?HENT:  ?   Head: Normocephalic and atraumatic.  ?   Mouth/Throat:  ?   Pharynx: Oropharynx is clear.  ?Eyes:  ?   Extraocular Movements: Extraocular movements intact.  ?   Pupils: Pupils are equal, round, and reactive to light.  ?Cardiovascular:  ?   Rate and Rhythm: Normal rate and regular rhythm.  ?Pulmonary:  ?   Comments: Decreased breath sounds bilaterally.  ?Abdominal:  ?   Palpations: Abdomen is soft.  ?Musculoskeletal:     ?   General: Normal range of motion.  ?   Cervical back: Normal range of motion.  ?Skin: ?   General: Skin is warm.  ?Neurological:  ?   General: No focal deficit present.  ?   Mental Status: She is alert and oriented to person, place, and time.  ?Psychiatric:     ?   Behavior: Behavior normal.     ?   Judgment: Judgment normal.  ? ? ? ?LABORATORY DATA:  ?I have reviewed the data as listed ?Lab Results  ?Component Value Date  ? WBC 7.8 10/26/2021  ? HGB 12.4  10/26/2021  ? HCT 40.6 10/26/2021  ? MCV 80.9 10/26/2021  ? PLT 274 10/26/2021  ? ?Recent Labs  ?  07/28/21 ?0820 10/26/21 ?1238 12/20/21 ?0945  ?NA  --  133* 135  ?K  --  4.1 4.4  ?CL  --  99 95*  ?CO2  --  24 27

## 2021-12-21 NOTE — Assessment & Plan Note (Addendum)
#   FEB 2023-MRI-[[facility]-incidental]-A right adrenal gland measuring up to 2.6 cm. The lesion closely abuts the adrenal gland and adrenal versus extra adrenal origin is difficult to determine. Differential- hypervascular neoplasm such as a pheochromocytoma or neuroendocrine tumor.  ? ?#Repeat/surveillance MRI May 2023-overall stable adrenal lesion [even present in 2011-measured 6 mm at the time]-suggestive of a slow-growing benign neoplasm.  ? ?#Reviewed the tumor conference in March 2023-given the slightly abnormal pheochromocytoma markers biopsy is on hold.  Patient s/p evaluation with endocrinology. Sent messg to Dr.Shamlefer, endo- [place] to discuss further plan of care.  ? ?#Incidental CT cardiac--1.1 cm/liver lesion dome; MRI liver-atypical hemangioma no concern for metastatic disease.  No further follow-up is recommended. ? ?#Bilateral lung nodules [20m nodules]-continue surveillance imaging/12 months as per radiology/PCP ? ?# DISPOSITION: ?# follow up to TBD--Dr.B ? ?Cc; Dr.Shamlefer; PCP ? ? ?

## 2021-12-26 ENCOUNTER — Other Ambulatory Visit: Payer: Medicare Other

## 2021-12-26 DIAGNOSIS — R1900 Intra-abdominal and pelvic swelling, mass and lump, unspecified site: Secondary | ICD-10-CM

## 2021-12-28 ENCOUNTER — Other Ambulatory Visit: Payer: Self-pay | Admitting: Internal Medicine

## 2021-12-28 ENCOUNTER — Other Ambulatory Visit: Payer: Medicare Other

## 2021-12-28 DIAGNOSIS — R1900 Intra-abdominal and pelvic swelling, mass and lump, unspecified site: Secondary | ICD-10-CM

## 2021-12-28 NOTE — Progress Notes (Unsigned)
Total volume 2,700.  Started 12-27-2021 at 10:00 a.m.  ended 12-28-2021 at 10:00 a.m. ?

## 2021-12-30 LAB — EXTRA SPECIMEN

## 2021-12-30 LAB — ALDOSTERONE + RENIN ACTIVITY W/ RATIO
ALDO / PRA Ratio: 1.4 Ratio (ref 0.9–28.9)
Aldosterone: 15 ng/dL
Renin Activity: 10.76 ng/mL/h — ABNORMAL HIGH (ref 0.25–5.82)

## 2022-01-06 ENCOUNTER — Telehealth: Payer: Self-pay | Admitting: Internal Medicine

## 2022-01-06 DIAGNOSIS — R825 Elevated urine levels of drugs, medicaments and biological substances: Secondary | ICD-10-CM | POA: Insufficient documentation

## 2022-01-06 NOTE — Telephone Encounter (Signed)
Heather Carter,   I tried to call the pt to discuss her elevated urine hormones. But it went to voice mail and I am assuming its because my number was blocked.    I left her a voice mail to call you and relay the message   Let her know I am out of the office for next week but I didn't not want to wait until I got back to do this    Her hormone in the using are elevated and need to do a special scan on the mass in her abdomen to see if its making these hormones.    Will have to wait on that scan before determining the next step   This will be done at [facility] and let her know this will have to be approved through her insurance first then she will be scheduled   Thanks

## 2022-01-11 ENCOUNTER — Other Ambulatory Visit: Payer: Self-pay | Admitting: Internal Medicine

## 2022-01-11 DIAGNOSIS — R825 Elevated urine levels of drugs, medicaments and biological substances: Secondary | ICD-10-CM

## 2022-01-11 DIAGNOSIS — R1900 Intra-abdominal and pelvic swelling, mass and lump, unspecified site: Secondary | ICD-10-CM

## 2022-01-11 LAB — METANEPHRINES, URINE, 24 HOUR
METANEPHRINE: 107 mcg/24 h (ref 90–315)
METANEPHRINES, TOTAL: 1216 mcg/24 h — ABNORMAL HIGH (ref 224–832)
NORMETANEPHRINE: 1109 mcg/24 h — ABNORMAL HIGH (ref 122–676)
Total Volume: 2700 mL

## 2022-01-11 LAB — CATECHOLAMINES, FRACTIONATED, URINE, 24 HOUR
Calc Total (E+NE): 102 mcg/24 h (ref 26–121)
Creatinine, Urine mg/day-CATEUR: 0.54 g/(24.h) (ref 0.50–2.15)
Dopamine 24 Hr Urine: 88 mcg/24 h (ref 52–480)
Norepinephrine, 24H, Ur: 102 mcg/24 h — ABNORMAL HIGH (ref 15–100)
Total Volume: 2700 mL

## 2022-01-11 LAB — CORTISOL, URINE, 24 HOUR
24 Hour urine volume (VMAHVA): 2700 mL
CREATININE, URINE: 0.54 g/(24.h) (ref 0.50–2.15)

## 2022-01-12 ENCOUNTER — Telehealth: Payer: Self-pay | Admitting: *Deleted

## 2022-01-12 NOTE — Telephone Encounter (Signed)
Patient called asking if she is to keep the appointment at [facility] next week or not since she went to [place]. Please notify patient of answer

## 2022-01-13 NOTE — Telephone Encounter (Signed)
Left message notifying patient that if she was referring to the appt with [facility] endocrinologist MD says [facility] to hold off at this time.  Also advised her to give [place] a call back if she is asking about another specialty appt at [facility].

## 2022-01-15 ENCOUNTER — Other Ambulatory Visit: Payer: Self-pay | Admitting: Internal Medicine

## 2022-01-16 ENCOUNTER — Encounter: Payer: Self-pay | Admitting: Internal Medicine

## 2022-01-16 ENCOUNTER — Other Ambulatory Visit: Payer: Self-pay | Admitting: Internal Medicine

## 2022-01-16 DIAGNOSIS — R825 Elevated urine levels of drugs, medicaments and biological substances: Secondary | ICD-10-CM

## 2022-01-16 NOTE — Telephone Encounter (Signed)
Another attempt at contacting the pt was done 01/16/2022 at 8 am without success. A letter has been mailed    Dodatate scan has been canceled by radiology and a new order for [employer] CU-64 DETECTNET has been placed    Abby Nena Jordan, MD  [facility] Endocrinology  [facility] [address] [place], [place] [address] Phone: [phone] FAX: [phone]

## 2022-02-03 ENCOUNTER — Encounter (HOSPITAL_COMMUNITY): Admission: RE | Admit: 2022-02-03 | Payer: Medicare Other | Source: Ambulatory Visit

## 2022-02-08 ENCOUNTER — Encounter (HOSPITAL_COMMUNITY)
Admission: RE | Admit: 2022-02-08 | Discharge: 2022-02-08 | Disposition: A | Discharge status: Home / Self Care | Payer: Medicare Other | Source: Ambulatory Visit | Attending: Internal Medicine | Admitting: Internal Medicine

## 2022-02-08 DIAGNOSIS — Z90710 Acquired absence of both cervix and uterus: Secondary | ICD-10-CM | POA: Insufficient documentation

## 2022-02-08 DIAGNOSIS — R160 Hepatomegaly, not elsewhere classified: Secondary | ICD-10-CM | POA: Diagnosis not present

## 2022-02-08 DIAGNOSIS — R9341 Abnormal radiologic findings on diagnostic imaging of renal pelvis, ureter, or bladder: Secondary | ICD-10-CM | POA: Insufficient documentation

## 2022-02-08 DIAGNOSIS — R825 Elevated urine levels of drugs, medicaments and biological substances: Secondary | ICD-10-CM | POA: Insufficient documentation

## 2022-02-08 DIAGNOSIS — E279 Disorder of adrenal gland, unspecified: Secondary | ICD-10-CM | POA: Diagnosis not present

## 2022-02-08 DIAGNOSIS — D1809 Hemangioma of other sites: Secondary | ICD-10-CM | POA: Diagnosis not present

## 2022-02-08 MED ORDER — COPPER CU 64 DOTATATE 1 MCI/ML IV SOLN
4.0000 | Freq: Once | INTRAVENOUS | Status: AC
  Administered 2022-02-08: 3.98 via INTRAVENOUS

## 2022-02-10 ENCOUNTER — Telehealth: Payer: Self-pay

## 2022-02-10 NOTE — Telephone Encounter (Signed)
Patient advised of result and will wait for the Oncologist

## 2022-02-10 NOTE — Telephone Encounter (Signed)
Patient calling for result of PET scan.

## 2022-02-21 ENCOUNTER — Telehealth: Payer: Self-pay

## 2022-02-21 NOTE — Telephone Encounter (Signed)
Patient wants to know if the extra hormones could be the reason she feels weak and has fatigue.

## 2022-02-24 NOTE — Telephone Encounter (Signed)
Spoke with patient and advise that she will need to discuss with surgeon

## 2022-03-17 ENCOUNTER — Telehealth: Payer: Self-pay | Admitting: *Deleted

## 2022-03-17 NOTE — Telephone Encounter (Signed)
Call from [place] reporting that patient went to [place] and she was told that the procedure she needs to have done could not be done there and now she is confused what she needs to do. She asked that ewe call patient regarding this or we can call [facility] back

## 2022-03-17 NOTE — Telephone Encounter (Signed)
Patient was referred to endocrinologist in [place].  I believe the endocrinologist is the office that is recommending the procedure in question.  The patient/Heather Carter should contact the [facility] for more information.  I have tried calling the patient to inform her she should follow recommendations from endocrinologist.  No answer, left general message advising her to contact the office that is recommending the procedure.  Tried calling [facility] but there was no answer and no option to leave a message.

## 2022-06-27 ENCOUNTER — Other Ambulatory Visit: Payer: Self-pay | Admitting: Family Medicine

## 2022-06-27 DIAGNOSIS — R911 Solitary pulmonary nodule: Secondary | ICD-10-CM

## 2022-07-11 ENCOUNTER — Ambulatory Visit
Admission: RE | Admit: 2022-07-11 | Discharge: 2022-07-11 | Disposition: A | Discharge status: Home / Self Care | Payer: Medicare Other | Source: Ambulatory Visit | Attending: Family Medicine | Admitting: Family Medicine

## 2022-07-11 DIAGNOSIS — R911 Solitary pulmonary nodule: Secondary | ICD-10-CM | POA: Insufficient documentation

## 2022-07-26 ENCOUNTER — Ambulatory Visit: Payer: Medicare Other | Admitting: Internal Medicine

## 2022-07-26 NOTE — Progress Notes (Deleted)
Name: Heather Carter  MRN/ DOB: [id number], [birth year]    Age/ Sex: 66 y.o., female    PCP: Aura Dials, MD   Reason for Endocrinology Evaluation: Abdominal Mass      Date of Initial Endocrinology Evaluation: 12/20/2021    HPI: Heather Carter is a 66 y.o. female with a past medical history of HTN, T2DM,. The patient presented for initial endocrinology clinic visit on 12/20/2021 for consultative assistance with her Abdominal mass .   The patient has been referred  for further evaluation of abdominal mass, located medial to the right adrenal gland.  Per radiology report this mass has been noted since 2011 when it was 6 mm at the time  The mass is not otherwise from the adrenal gland  Endocrinology evaluation included a normal aldosterone level at 15 NG/DL, nonsuppressed renin at 10.76 NG/mL/H   24-hour urine showed normal dopamine at 88 mcg/24 hr, normal metanephrine at 107, elevated normetanephrine at 1109 (reference 122-676) .  Normal epinephrine , and slightly elevated norepinephrine at 102 mcg . Free urinary cortisol was below reported range in May 2023  A nuclear medicine PET scan 01/31/2022 showed a solid mass medial to the right adrenal gland measuring up to 1.9 cm with a low radiotracer activity which is similar to background liver activity, and the conclusion that the right suprarenal mass is NOT consistent with well-differentiated neuroendocrine tumor  Patient was referred for surgical intervention  Patient continues to follow-up with oncology for hepatic and lung nodules  HISTORY:  Past Medical History:  Past Medical History:  Diagnosis Date   Arthritis    Asthma    Depression    Diabetes mellitus without complication (HCC)    GERD (gastroesophageal reflux disease)    High cholesterol    Hypertension    Liver lesion    Past Surgical History:  Past Surgical History:  Procedure Laterality Date   ABDOMINAL HYSTERECTOMY     CHOLECYSTECTOMY      COLONOSCOPY WITH PROPOFOL N/A 07/18/2019   Procedure: COLONOSCOPY WITH PROPOFOL;  Surgeon: Lin Landsman, MD;  Location: [facility] ENDOSCOPY;  Service: Gastroenterology;  Laterality: N/A;   ESOPHAGOGASTRODUODENOSCOPY (EGD) WITH PROPOFOL N/A 07/18/2019   Procedure: ESOPHAGOGASTRODUODENOSCOPY (EGD) WITH PROPOFOL;  Surgeon: Lin Landsman, MD;  Location: [facility] ENDOSCOPY;  Service: Gastroenterology;  Laterality: N/A;   LAPAROSCOPIC APPENDECTOMY N/A 08/19/2019   Procedure: APPENDECTOMY LAPAROSCOPIC;  Surgeon: Olean Ree, MD;  Location: [facility] ORS;  Service: General;  Laterality: N/A;    Social History:  reports that she has quit smoking. She has never used smokeless tobacco. She reports that she does not drink alcohol and does not use drugs. Family History: family history includes Heart disease in her father and mother; Stomach cancer in her brother.   HOME MEDICATIONS: Allergies as of 07/26/2022       Reactions   Codeine Nausea And Vomiting        Medication List        Accurate as of July 26, 2022  7:10 AM. If you have any questions, ask your nurse or doctor.          acetaminophen 650 MG CR tablet Commonly known as: TYLENOL Take 650-1,300 mg by mouth every 8 (eight) hours as needed for pain.   albuterol 108 (90 Base) MCG/ACT inhaler Commonly known as: VENTOLIN HFA Inhale 1-2 puffs into the lungs every 6 (six) hours as needed for wheezing or shortness of breath.   albuterol 108 (90 Base)  MCG/ACT inhaler Commonly known as: VENTOLIN HFA Inhale into the lungs every 6 (six) hours as needed for wheezing or shortness of breath.   atenolol 25 MG tablet Commonly known as: TENORMIN Take 25 mg by mouth daily.   cyclobenzaprine 10 MG tablet Commonly known as: FLEXERIL Take 1 tablet (10 mg total) by mouth 2 (two) times daily as needed for muscle spasms.   enalapril 10 MG tablet Commonly known as: VASOTEC Take 10 mg by mouth daily.   glipiZIDE XL 10 MG 24 hr  tablet Generic drug: glipiZIDE Take 10 mg by mouth daily.   Jardiance 25 MG Tabs tablet Generic drug: empagliflozin Take 25 mg by mouth daily.   metFORMIN 500 MG 24 hr tablet Commonly known as: GLUCOPHAGE-XR Take 1,000 mg by mouth 2 (two) times daily.   omeprazole 20 MG capsule Commonly known as: PRILOSEC Take 20 mg by mouth daily.   rosuvastatin 10 MG tablet Commonly known as: CRESTOR Take 10 mg by mouth at bedtime.   Rybelsus 7 MG Tabs Generic drug: Semaglutide Take 3 mg by mouth daily.   sertraline 100 MG tablet Commonly known as: ZOLOFT Take 100 mg by mouth at bedtime.   spironolactone 50 MG tablet Commonly known as: ALDACTONE Take 50 mg by mouth daily.   triamterene-hydrochlorothiazide 75-50 MG tablet Commonly known as: MAXZIDE Take 1 tablet by mouth daily.   zolpidem 5 MG tablet Commonly known as: AMBIEN Take 5 mg by mouth at bedtime as needed for sleep.          REVIEW OF SYSTEMS: A comprehensive ROS was conducted with the patient and is negative except as per HPI     OBJECTIVE:  VS: There were no vitals taken for this visit.   Wt Readings from Last 3 Encounters:  12/21/21 155 lb 3.2 oz (70.4 kg)  12/20/21 153 lb 6.4 oz (69.6 kg)  11/15/21 155 lb 12.8 oz (70.7 kg)     EXAM: General: Pt appears well and is in NAD  Neck: General: Supple without adenopathy. Thyroid: Thyroid size normal.  No goiter or nodules appreciated.   Lungs: Clear with good BS bilat with no rales, rhonchi, or wheezes  Heart: Auscultation: RRR.  Abdomen: Normoactive bowel sounds, soft, nontender, without masses or organomegaly palpable  Extremities:  BL LE: No pretibial edema normal ROM and strength.  Mental Status: Judgment, insight: Intact Orientation: Oriented to time, place, and person Mood and affect: No depression, anxiety, or agitation     DATA REVIEWED:  Latest Reference Range & Units 12/20/21 09:45  Sodium 135 - 145 mEq/L 135  Potassium 3.5 - 5.1 mEq/L 4.4   Chloride 96 - 112 mEq/L 95 (L)  CO2 19 - 32 mEq/L 27  Glucose 70 - 99 mg/dL 233 (H)  BUN 6 - 23 mg/dL 20  Creatinine 0.40 - 1.20 mg/dL 1.32 (H)  Calcium 8.4 - 10.5 mg/dL 10.4  GFR >60.00 mL/min 41.55 (L)        CT Abdomen 12/19/2021  Adrenals/Urinary Tract: There is a well-defined solid and diffusely enhancing mass identified just medial to the right adrenal gland which measures 2 x 1.7 x 2.8 cm in AP, transverse and craniocaudal dimensions. The mass appears to abut rather than arise from the adrenal gland. There was a 6 mm enhancing nodule in this location on previous CT in 2011 which is also retrospectively visualized on a thoracic spine CT in 2018 measuring 12 x 6 mm. Adrenal glands appear otherwise normal. 2.8 cm exophytic cyst in  the lower left kidney. No enhancing renal mass identified. No hydronephrosis. Urinary bladder and urethra appear within normal limits.    ASSESSMENT/PLAN/RECOMMENDATIONS:   Abdominal mass:  -Per abdominal imaging the 2.8 cm near mass appears that it abuts wider than arises from the right adrenal gland. -She had urinary metanephrines done through oncology on 10/26/2021 with an elevation of normetanephrine at 794 (reference 131-612 UG) , with normal metanephrine.  As well as elevated plasma normetanephrine at 555.7 (reference 0-285) . 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  -We will proceed with renin, aldosterone check as well as 24-hour urinary cortisol  -If her metanephrines or catecholamines are elevated we will proceed with further imaging of the lesion -If work-up comes back negative I will refer her for further evaluation by surgery    Follow-up pending lab results    Signed electronically by: Mack Guise,  MD  [facility] Endocrinology  [facility] [address] [place], [place] [address] Phone: [phone] FAX: [phone]   CC: Aura Dials, MD [place] [address] Phone: [phone] Fax: [phone]   Return to Endocrinology clinic as below: Future Appointments  Date Time Provider [place]  07/26/2022 10:50 AM Deliyah Muckle, Melanie Crazier, MD [id number] None

## 2022-08-30 ENCOUNTER — Other Ambulatory Visit: Payer: Self-pay | Admitting: Family Medicine

## 2022-08-30 DIAGNOSIS — Z1231 Encounter for screening mammogram for malignant neoplasm of breast: Secondary | ICD-10-CM

## 2022-10-23 ENCOUNTER — Ambulatory Visit (HOSPITAL_COMMUNITY): Payer: Medicare Other

## 2022-11-01 ENCOUNTER — Encounter (HOSPITAL_COMMUNITY): Payer: Self-pay | Admitting: *Deleted

## 2022-11-01 ENCOUNTER — Emergency Department (HOSPITAL_COMMUNITY): Payer: 59

## 2022-11-01 ENCOUNTER — Other Ambulatory Visit: Payer: Self-pay

## 2022-11-01 ENCOUNTER — Emergency Department (HOSPITAL_COMMUNITY)
Admission: EM | Admit: 2022-11-01 | Discharge: 2022-11-01 | Disposition: A | Discharge status: Home / Self Care | Payer: 59 | Attending: Emergency Medicine | Admitting: Emergency Medicine

## 2022-11-01 DIAGNOSIS — E871 Hypo-osmolality and hyponatremia: Secondary | ICD-10-CM | POA: Insufficient documentation

## 2022-11-01 DIAGNOSIS — R5383 Other fatigue: Secondary | ICD-10-CM | POA: Insufficient documentation

## 2022-11-01 DIAGNOSIS — E876 Hypokalemia: Secondary | ICD-10-CM | POA: Diagnosis not present

## 2022-11-01 DIAGNOSIS — R079 Chest pain, unspecified: Secondary | ICD-10-CM

## 2022-11-01 DIAGNOSIS — R5381 Other malaise: Secondary | ICD-10-CM

## 2022-11-01 HISTORY — DX: Non-pressure chronic ulcer of skin of other sites with unspecified severity: L98.499

## 2022-11-01 HISTORY — DX: Non-pressure chronic ulcer of abdomen with unspecified severity: L98.439

## 2022-11-01 LAB — CBC WITH DIFFERENTIAL/PLATELET
Abs Immature Granulocytes: 0.02 10*3/uL (ref 0.00–0.07)
Basophils Absolute: 0.1 10*3/uL (ref 0.0–0.1)
Basophils Relative: 1 %
Eosinophils Absolute: 0.3 10*3/uL (ref 0.0–0.5)
Eosinophils Relative: 4 %
HCT: 37.6 % (ref 36.0–46.0)
Hemoglobin: 11.5 g/dL — ABNORMAL LOW (ref 12.0–15.0)
Immature Granulocytes: 0 %
Lymphocytes Relative: 31 %
Lymphs Abs: 2.2 10*3/uL (ref 0.7–4.0)
MCH: 24.1 pg — ABNORMAL LOW (ref 26.0–34.0)
MCHC: 30.6 g/dL (ref 30.0–36.0)
MCV: 78.7 fL — ABNORMAL LOW (ref 80.0–100.0)
Monocytes Absolute: 0.6 10*3/uL (ref 0.1–1.0)
Monocytes Relative: 8 %
Neutro Abs: 4.1 10*3/uL (ref 1.7–7.7)
Neutrophils Relative %: 56 %
Platelets: 266 10*3/uL (ref 150–400)
RBC: 4.78 MIL/uL (ref 3.87–5.11)
RDW: 16.2 % — ABNORMAL HIGH (ref 11.5–15.5)
WBC: 7.2 10*3/uL (ref 4.0–10.5)
nRBC: 0 % (ref 0.0–0.2)

## 2022-11-01 LAB — BASIC METABOLIC PANEL
Anion gap: 12 (ref 5–15)
BUN: 24 mg/dL — ABNORMAL HIGH (ref 8–23)
CO2: 24 mmol/L (ref 22–32)
Calcium: 9.5 mg/dL (ref 8.9–10.3)
Chloride: 94 mmol/L — ABNORMAL LOW (ref 98–111)
Creatinine, Ser: 1.45 mg/dL — ABNORMAL HIGH (ref 0.44–1.00)
GFR, Estimated: 39 mL/min — ABNORMAL LOW (ref >60)
Glucose, Bld: 274 mg/dL — ABNORMAL HIGH (ref 70–99)
Potassium: 3.2 mmol/L — ABNORMAL LOW (ref 3.5–5.1)
Sodium: 130 mmol/L — ABNORMAL LOW (ref 135–145)

## 2022-11-01 LAB — TROPONIN I (HIGH SENSITIVITY)
Troponin I (High Sensitivity): 3 ng/L (ref <18)
Troponin I (High Sensitivity): 3 ng/L (ref <18)

## 2022-11-01 MED ORDER — POTASSIUM CHLORIDE CRYS ER 20 MEQ PO TBCR
40.0000 meq | EXTENDED_RELEASE_TABLET | Freq: Once | ORAL | Status: AC
  Administered 2022-11-01: 40 meq via ORAL
  Filled 2022-11-01: qty 2

## 2022-11-01 MED ORDER — SODIUM CHLORIDE 0.9 % IV BOLUS
1000.0000 mL | Freq: Once | INTRAVENOUS | Status: AC
  Administered 2022-11-01: 1000 mL via INTRAVENOUS

## 2022-11-01 NOTE — Discharge Instructions (Addendum)
Your potassium level today was slightly low as well as your sodium level.  You have been given IV fluids and potassium here.  Please follow-up with your primary care provider for recheck

## 2022-11-01 NOTE — ED Provider Notes (Signed)
[facility] Provider Note   CSN: [id number] Arrival date & time: 11/01/22  1601     History  Chief Complaint  Patient presents with   EKG changes    Heather Carter is a 67 y.o. female.  HPI     Heather Carter is a 67 y.o. female who presents to the Emergency Department sent here by the mentation of PCP for evaluation of EKG changes seen in office earlier today.  Patient denies chest pain or shortness of breath at this time, had some slight chest pain earlier today.  she does endorse malaise. No recent fever, chills, cough abdominal pain vomiting or diarrhea.  Denies prior cardiac history.    Home Medications Prior to Admission medications   Medication Sig Start Date End Date Taking? Authorizing Provider  acetaminophen (TYLENOL) 650 MG CR tablet Take 650-1,300 mg by mouth every 8 (eight) hours as needed for pain.    [provider]  albuterol (VENTOLIN HFA) 108 (90 Base) MCG/ACT inhaler Inhale 1-2 puffs into the lungs every 6 (six) hours as needed for wheezing or shortness of breath.     [provider]  albuterol (VENTOLIN HFA) 108 (90 Base) MCG/ACT inhaler Inhale into the lungs every 6 (six) hours as needed for wheezing or shortness of breath.    [provider]  atenolol (TENORMIN) 25 MG tablet Take 25 mg by mouth daily. 05/21/19   [provider]  cyclobenzaprine (FLEXERIL) 10 MG tablet Take 1 tablet (10 mg total) by mouth 2 (two) times daily as needed for muscle spasms. 05/14/17   Blanchie Dessert, MD  enalapril (VASOTEC) 10 MG tablet Take 10 mg by mouth daily.    [provider]  GLIPIZIDE XL 10 MG 24 hr tablet Take 10 mg by mouth daily. 05/19/19   [provider]  JARDIANCE 25 MG TABS tablet Take 25 mg by mouth daily. 05/19/21   [provider]  metFORMIN (GLUCOPHAGE-XR) 500 MG 24 hr tablet Take 1,000 mg by mouth 2 (two) times daily. 05/19/19   [provider]   omeprazole (PRILOSEC) 20 MG capsule Take 20 mg by mouth daily. 05/21/19   [provider]  rosuvastatin (CRESTOR) 10 MG tablet Take 10 mg by mouth at bedtime. 05/26/21   [provider]  RYBELSUS 7 MG TABS Take 3 mg by mouth daily. 01/19/21   [provider]  sertraline (ZOLOFT) 100 MG tablet Take 100 mg by mouth at bedtime.  05/15/19   [provider]  spironolactone (ALDACTONE) 50 MG tablet Take 50 mg by mouth daily. 07/25/21   [provider]  triamterene-hydrochlorothiazide (MAXZIDE) 75-50 MG tablet Take 1 tablet by mouth daily. 05/21/19   [provider]  zolpidem (AMBIEN) 5 MG tablet Take 5 mg by mouth at bedtime as needed for sleep. 06/21/19   [provider]      Allergies    Codeine    Review of Systems   Review of Systems  Constitutional:  Positive for fatigue. Negative for appetite change, chills and fever.  Respiratory:  Negative for cough and shortness of breath.   Cardiovascular:  Negative for chest pain.  Gastrointestinal:  Negative for abdominal pain, diarrhea, nausea and vomiting.  Genitourinary:  Negative for difficulty urinating and dysuria.  Neurological:  Negative for dizziness, weakness, numbness and headaches.  Psychiatric/Behavioral:  Negative for confusion.     Physical Exam Updated Vital Signs BP 132/83 (BP Location: Right Arm)  Pulse 63   Temp 98 F (36.7 C) (Oral)   Resp 18   Ht 5\' 5"  (1.651 m)   Wt 70.8 kg   SpO2 99%   BMI 25.96 kg/m  Physical Exam Vitals and nursing note reviewed.  Constitutional:      General: She is not in acute distress.    Appearance: Normal appearance. She is not ill-appearing or toxic-appearing.  HENT:     Mouth/Throat:     Mouth: Mucous membranes are moist.  Eyes:     Extraocular Movements: Extraocular movements intact.     Pupils: Pupils are equal, round, and reactive to light.  Cardiovascular:     Rate and Rhythm: Normal rate and regular rhythm.      Pulses: Normal pulses.  Pulmonary:     Effort: Pulmonary effort is normal. No respiratory distress.  Chest:     Chest wall: No tenderness.  Abdominal:     Palpations: Abdomen is soft.  Musculoskeletal:     Cervical back: Normal range of motion.     Right lower leg: No edema.     Left lower leg: No edema.  Skin:    General: Skin is warm.     Capillary Refill: Capillary refill takes less than 2 seconds.  Neurological:     General: No focal deficit present.     Mental Status: She is alert.     Sensory: No sensory deficit.     Motor: No weakness.     ED Results / Procedures / Treatments   Labs (all labs ordered are listed, but only abnormal results are displayed) Labs Reviewed  CBC WITH DIFFERENTIAL/PLATELET - Abnormal; Notable for the following components:      Result Value   Hemoglobin 11.5 (*)    MCV 78.7 (*)    MCH 24.1 (*)    RDW 16.2 (*)    All other components within normal limits  BASIC METABOLIC PANEL - Abnormal; Notable for the following components:   Sodium 130 (*)    Potassium 3.2 (*)    Chloride 94 (*)    Glucose, Bld 274 (*)    BUN 24 (*)    Creatinine, Ser 1.45 (*)    GFR, Estimated 39 (*)    All other components within normal limits  TROPONIN I (HIGH SENSITIVITY)  TROPONIN I (HIGH SENSITIVITY)    EKG EKG Interpretation  Date/Time:  Wednesday November 01 2022 16:15:36 EDT Ventricular Rate:  73 PR Interval:  166 QRS Duration: 68 QT Interval:  390 QTC Calculation: 429 R Axis:   -31 Text Interpretation: Sinus rhythm Confirmed by Godfrey Pick [phone]) on 11/01/2022 9:29:59 PM  Radiology DG Chest 2 View  Result Date: 11/01/2022 CLINICAL DATA:  Chest pain and back pain. EXAM: CHEST - 2 VIEW COMPARISON:  May 14, 2017 FINDINGS: The heart size and mediastinal contours are within normal limits. Both lungs are clear. Radiopaque surgical clips are seen within the right upper quadrant. Multilevel degenerative changes are seen throughout the thoracic spine.  IMPRESSION: No active cardiopulmonary disease. Electronically Signed   By: Virgina Norfolk M.D.   On: 11/01/2022 16:38    Procedures Procedures    Medications Ordered in ED Medications  sodium chloride 0.9 % bolus 1,000 mL (1,000 mLs Intravenous New Bag/Given 11/01/22 2055)  potassium chloride SA (KLOR-CON M) CR tablet 40 mEq (40 mEq Oral Given 11/01/22 2056)    ED Course/ Medical Decision Making/ A&P  Medical Decision Making Patient sent here under the advisement of her PCP for EKG changes that was seen in office earlier today.  Patient complains of some generalized malaise and fatigue but otherwise denies any symptoms.  Vital signs are reassuring,  Plan includes check labs, troponins and chest x-ray and repeat EKG.  She denies any chest pain at present no shortness of breath.  Her vital signs are reassuring.  PE considered but felt less likely.  Pneumonia also considered but given lack of cough and fever this is also felt to be less likely  Amount and/or Complexity of Data Reviewed Labs: ordered.    Details: Labs interpreted by me, no evidence of leukocytosis, hemoglobin 11.5.  Chemistries mild hyponatremia and hypokalemia with potassium 3.2.  Sodium 130. Radiology: ordered.    Details: Chest x-ray without acute cardiopulmonary finding. ECG/medicine tests: ordered.    Details: EKG shows sinus rhythm Discussion of management or test interpretation with external provider(s): On recheck, patient resting comfortably.  Has received oral potassium here and normal saline. Appears appropriate for discharge home, will follow-up with PCP for recheck.  Return precautions discussed  Risk Prescription drug management.           Final Clinical Impression(s) / ED Diagnoses Final diagnoses:  Malaise  Chest pain, unspecified type    Rx / DC Orders ED Discharge Orders     None         Kem Parkinson, PA-C 11/03/22 0925    Godfrey Pick,  MD 11/07/22 1736

## 2022-11-01 NOTE — ED Triage Notes (Signed)
Pt seen PCP and had an EKG done and noted some changes.  Pt states she had some CP earlier but denies at present.

## 2022-11-27 ENCOUNTER — Encounter (HOSPITAL_COMMUNITY): Payer: Self-pay

## 2022-11-27 ENCOUNTER — Ambulatory Visit (HOSPITAL_COMMUNITY)
Admission: RE | Admit: 2022-11-27 | Discharge: 2022-11-27 | Disposition: A | Discharge status: Home / Self Care | Payer: 59 | Source: Ambulatory Visit | Attending: Family Medicine | Admitting: Family Medicine

## 2022-11-27 DIAGNOSIS — Z1231 Encounter for screening mammogram for malignant neoplasm of breast: Secondary | ICD-10-CM | POA: Diagnosis present

## 2022-11-29 ENCOUNTER — Ambulatory Visit (INDEPENDENT_AMBULATORY_CARE_PROVIDER_SITE_OTHER): Payer: 59 | Admitting: Surgery

## 2022-11-29 ENCOUNTER — Encounter: Payer: Self-pay | Admitting: Surgery

## 2022-11-29 VITALS — BP 118/76 | HR 67 | Temp 97.6°F | Ht 66.0 in | Wt 153.8 lb

## 2022-11-29 DIAGNOSIS — E278 Other specified disorders of adrenal gland: Secondary | ICD-10-CM

## 2022-11-29 DIAGNOSIS — K689 Other disorders of retroperitoneum: Secondary | ICD-10-CM | POA: Diagnosis not present

## 2022-11-29 NOTE — Patient Instructions (Addendum)
Your CT is scheduled for 12/06/2022 5:30 pm (arrive by 3:30 pm) at [facility].    If you have any concerns or questions, please feel free to call our office. See follow up appointment below.

## 2022-11-30 ENCOUNTER — Telehealth: Payer: Self-pay

## 2022-11-30 DIAGNOSIS — E278 Other specified disorders of adrenal gland: Secondary | ICD-10-CM

## 2022-11-30 NOTE — Telephone Encounter (Signed)
Referral sent to Endocrinologist 

## 2022-11-30 NOTE — Progress Notes (Signed)
Patient ID: Heather Carter, female   DOB: Jul 20, 1956, 66 y.o.   MRN: [id number]  HPI Heather Carter is a 67 y.o. female seen in consultation for a persistent right retroperitoneal mass.  This has been in the works for least a year and a half.  She has had multiple CT scans, MRIs,  as well as evaluation by endocrinology.  As well as dotatate scan .  Please note that the dotatate scan does not show evidence of neuroendocrine tumor but could not rule out a paraganglioma.  She has had some compliance issues. Dr. Howell Pringle endocrinology saw her and she did have some mildly increased levels of catecholamines but the not sure if there workup was completely done .  Endorses some fatigue and being tired.  Does have a history of diabetes, GERD.  Prior cholecystectomy and abdominal hysterectomy as well as an appendectomy. Denies any fevers any chills any type B symptoms.  No weight loss. Note that I have personally reviewed the MRI the CT is showing evidence of right supra extra peritoneal mass abutting the right adrenal gland.  This nodule measures about 2.5 cm. He has had significant workup by endocrinologist showing mildly elevation of catecholamines but no other functional sources. She does have hiatal hernias as well. Recent CBC and CMP was normal except mild increase in creatinine to 1.45.  HPI  Past Medical History:  Diagnosis Date   Arthritis    Asthma    Depression    Diabetes mellitus without complication    GERD (gastroesophageal reflux disease)    High cholesterol    Hypertension    Liver lesion    Ulcer of abdomen wall     Past Surgical History:  Procedure Laterality Date   ABDOMINAL HYSTERECTOMY     CHOLECYSTECTOMY     COLONOSCOPY WITH PROPOFOL N/A 07/18/2019   Procedure: COLONOSCOPY WITH PROPOFOL;  Surgeon: Toney Reil, MD;  Location: [facility] ENDOSCOPY;  Service: Gastroenterology;  Laterality: N/A;   ESOPHAGOGASTRODUODENOSCOPY (EGD) WITH PROPOFOL N/A 07/18/2019    Procedure: ESOPHAGOGASTRODUODENOSCOPY (EGD) WITH PROPOFOL;  Surgeon: Toney Reil, MD;  [facility] ENDOSCOPY;  Service: Gastroenterology;  Laterality: N/A;   LAPAROSCOPIC APPENDECTOMY N/A 08/19/2019   Procedure: APPENDECTOMY LAPAROSCOPIC;  Surgeon: Henrene Dodge, MD;  Location: [facility] ORS;  Service: General;  Laterality: N/A;    Family History  Problem Relation Age of Onset   Heart disease Mother    Heart disease Father    Stomach cancer Brother     Social History Social History   Tobacco Use   Smoking status: Former   Smokeless tobacco: Never  Building services engineer Use: Never used  Substance Use Topics   Alcohol use: No   Drug use: No    Allergies  Allergen Reactions   Codeine Nausea And Vomiting    Current Outpatient Medications  Medication Sig Dispense Refill   acetaminophen (TYLENOL) 650 MG CR tablet Take 650-1,300 mg by mouth every 8 (eight) hours as needed for pain.     albuterol (VENTOLIN HFA) 108 (90 Base) MCG/ACT inhaler Inhale 1-2 puffs into the lungs every 6 (six) hours as needed for wheezing or shortness of breath.      albuterol (VENTOLIN HFA) 108 (90 Base) MCG/ACT inhaler Inhale into the lungs every 6 (six) hours as needed for wheezing or shortness of breath.     atenolol (TENORMIN) 25 MG tablet Take 25 mg by mouth daily.     cyclobenzaprine (FLEXERIL) 10 MG tablet Take 1  tablet (10 mg total) by mouth 2 (two) times daily as needed for muscle spasms. 20 tablet 0   enalapril (VASOTEC) 10 MG tablet Take 10 mg by mouth daily.     GLIPIZIDE XL 10 MG 24 hr tablet Take 10 mg by mouth daily.     JARDIANCE 25 MG TABS tablet Take 25 mg by mouth daily.     metFORMIN (GLUCOPHAGE-XR) 500 MG 24 hr tablet Take 1,000 mg by mouth 2 (two) times daily.     omeprazole (PRILOSEC) 20 MG capsule Take 20 mg by mouth daily.     rosuvastatin (CRESTOR) 10 MG tablet Take 10 mg by mouth at bedtime.     RYBELSUS 7 MG TABS Take 3 mg by mouth daily.     sertraline (ZOLOFT) 100 MG  tablet Take 100 mg by mouth at bedtime.      spironolactone (ALDACTONE) 50 MG tablet Take 50 mg by mouth daily.     triamterene-hydrochlorothiazide (MAXZIDE) 75-50 MG tablet Take 1 tablet by mouth daily.     zolpidem (AMBIEN) 5 MG tablet Take 5 mg by mouth at bedtime as needed for sleep.     No current facility-administered medications for this visit.     Review of Systems Full ROS  was asked and was negative except for the information on the HPI  Physical Exam Blood pressure 118/76, pulse 67, temperature 97.6 F (36.4 C), temperature source Oral, height 5\' 6"  (1.676 m), weight 153 lb 12.8 oz (69.8 kg), SpO2 99 %. CONSTITUTIONAL: NAD. EYES: Pupils are equal, round, , Sclera are non-icteric. EARS, NOSE, MOUTH AND THROAT: The oropharynx is clear. The oral mucosa is pink and moist. Hearing is intact to voice. LYMPH NODES:  Lymph nodes in the neck are normal. RESPIRATORY:  Lungs are clear. There is normal respiratory effort, with equal breath sounds bilaterally, and without pathologic use of accessory muscles. CARDIOVASCULAR: Heart is regular without murmurs, gallops, or rubs. GI: The abdomen is  soft, nontender, and nondistended. There are no palpable masses. There is no hepatosplenomegaly. There are normal bowel sounds in all quadrants. GU: Rectal deferred.   MUSCULOSKELETAL: Normal muscle strength and tone. No cyanosis or edema.   SKIN: Turgor is good and there are no pathologic skin lesions or ulcers. NEUROLOGIC: Motor and sensation is grossly normal. Cranial nerves are grossly intact. PSYCH:  Oriented to person, place and time. Affect is normal.  Data Reviewed  I have personally reviewed the patient's imaging, laboratory findings and medical records.    Assessment/Plan 67 year old female with a right retroperitoneal mass abutting the right adrenal gland.  This seems to be separate from the right adrenal.  Differential certainly will include paraganglioma .  I do think that we need  to make sure we further study  this as there was somewhat equivocal data regarding whether or not she was producing some metanephrines.  We Will also like to see if there is being any changes on the size of the tumor and we will repeat a CT scan of the abdomen pelvis for now.  I have messaged Dr. Howell Pringle endocrinology.  Since there were some compliance issues . I do think at this time she does not need any urgent surgical intervention we need to figure it out a bit more about this retroperitoneal nodule/mass and whether or not this merits resection.  I do think that from a surgical perspective this can be approached robotically. We will see her back in a few weeks hopefully she will  get back to endocrinologist. Is not that I spent 60 minutes in this encounter including personally reviewing extensive medical records, imaging studies, coordinating her care and performing appropriate documentation   Sterling Big, MD FACS General Surgeon 11/30/2022, 9:37 AM

## 2022-12-06 ENCOUNTER — Other Ambulatory Visit: Payer: Self-pay | Admitting: Surgery

## 2022-12-06 ENCOUNTER — Ambulatory Visit
Admission: RE | Admit: 2022-12-06 | Discharge: 2022-12-06 | Disposition: A | Discharge status: Home / Self Care | Payer: 59 | Source: Ambulatory Visit | Attending: Surgery | Admitting: Surgery

## 2022-12-06 DIAGNOSIS — I700 Atherosclerosis of aorta: Secondary | ICD-10-CM | POA: Diagnosis not present

## 2022-12-06 DIAGNOSIS — E278 Other specified disorders of adrenal gland: Secondary | ICD-10-CM | POA: Diagnosis present

## 2022-12-06 DIAGNOSIS — N281 Cyst of kidney, acquired: Secondary | ICD-10-CM | POA: Insufficient documentation

## 2022-12-06 MED ORDER — IOHEXOL 300 MG/ML  SOLN
80.0000 mL | Freq: Once | INTRAMUSCULAR | Status: AC | PRN
  Administered 2022-12-06: 80 mL via INTRAVENOUS

## 2022-12-07 ENCOUNTER — Telehealth: Payer: Self-pay

## 2022-12-07 NOTE — Telephone Encounter (Signed)
-----   Message from Leafy Ro, MD sent at 12/07/2022  9:05 AM EDT ----- Please let her know that her lesion is stable, has increased slightly. Please make sure she keeps her f/u appt ----- Message ----- From: Interface, Rad Results In Sent: 12/06/2022   5:18 PM EDT To: Leafy Ro, MD

## 2022-12-07 NOTE — Telephone Encounter (Signed)
Message left for the patient to call back for her CT results.

## 2022-12-08 ENCOUNTER — Telehealth: Payer: Self-pay | Admitting: *Deleted

## 2022-12-08 NOTE — Telephone Encounter (Signed)
Patient called back and is now aware of her CT results and her follow up appointment on Monday

## 2022-12-11 ENCOUNTER — Encounter: Payer: Self-pay | Admitting: Surgery

## 2022-12-11 ENCOUNTER — Ambulatory Visit (INDEPENDENT_AMBULATORY_CARE_PROVIDER_SITE_OTHER): Payer: 59 | Admitting: Surgery

## 2022-12-11 VITALS — BP 112/73 | HR 94 | Temp 98.0°F | Ht 65.0 in | Wt 151.2 lb

## 2022-12-11 DIAGNOSIS — E278 Other specified disorders of adrenal gland: Secondary | ICD-10-CM

## 2022-12-11 NOTE — Patient Instructions (Addendum)
We will have you follow up here in one year with a CT scan done prior. We will send you a letter about these appointment.      Please call and ask to speak with a nurse if you develop questions or concerns.

## 2022-12-15 NOTE — Progress Notes (Signed)
Surgical Consultation  12/15/2022  Heather Carter is an 67 y.o. female.   Chief Complaint  Patient presents with   Follow-up     HPI: Heather Carter is a 67 y.o. female seen in F/U for a persistent right retroperitoneal mass.  This has been in the works for least a year and a half.  She has had multiple CT scans, MRIs,  as well as evaluation by endocrinology.  As well as dotatate scan .  Please note that the dotatate scan does not show evidence of neuroendocrine tumor but could not rule out a paraganglioma.  She has had some compliance issues. Dr. Howell Pringle endocrinology saw her and she did have some mildly increased levels of catecholamines but not to the point of being consider consistent with a functional tumor, I did touch base with her. No evidence of pheo or functional paraganglioma.   She Endorses some fatigue and being tired.  She Does have a history of diabetes, GERD.  Prior cholecystectomy and abdominal hysterectomy as well as an appendectomy. Denies any fevers any chills any type B symptoms.  No weight loss. Note that I have personally reviewed the MRI the CT is showing evidence of right supra extra peritoneal mass abutting the right adrenal gland.  This nodule measures about 2.5 cm. He has had significant workup by endocrinologist showing mildly elevation of catecholamines but no other functional sources. She does have hiatal hernias as well. Recent CBC and CMP was normal except mild increase in creatinine to 1.45.  Past Medical History:  Diagnosis Date   Arthritis    Asthma    Depression    Diabetes mellitus without complication (HCC)    GERD (gastroesophageal reflux disease)    High cholesterol    Hypertension    Liver lesion    Ulcer of abdomen wall (HCC)     Past Surgical History:  Procedure Laterality Date   ABDOMINAL HYSTERECTOMY     CHOLECYSTECTOMY     COLONOSCOPY WITH PROPOFOL N/A 07/18/2019   Procedure: COLONOSCOPY WITH PROPOFOL;  Surgeon:  Toney Reil, MD;  Location: [facility] ENDOSCOPY;  Service: Gastroenterology;  Laterality: N/A;   ESOPHAGOGASTRODUODENOSCOPY (EGD) WITH PROPOFOL N/A 07/18/2019   Procedure: ESOPHAGOGASTRODUODENOSCOPY (EGD) WITH PROPOFOL;  Surgeon: Toney Reil, MD;  Location: [facility] ENDOSCOPY;  Service: Gastroenterology;  Laterality: N/A;   LAPAROSCOPIC APPENDECTOMY N/A 08/19/2019   Procedure: APPENDECTOMY LAPAROSCOPIC;  Surgeon: Henrene Dodge, MD;  Location: [facility] ORS;  Service: General;  Laterality: N/A;    Family History  Problem Relation Age of Onset   Heart disease Mother    Heart disease Father    Stomach cancer Brother     Social History:  reports that she has quit smoking. She has been exposed to tobacco smoke. She has never used smokeless tobacco. She reports that she does not drink alcohol and does not use drugs.  Allergies:  Allergies  Allergen Reactions   Codeine Nausea And Vomiting    Medications reviewed.     ROS Full ROS performed and is otherwise negative other than what is stated in the HPI    BP 112/73   Pulse 94   Temp 98 F (36.7 C)   Ht 5\' 5"  (1.651 m)   Wt 151 lb 3.2 oz (68.6 kg)   SpO2 96%   BMI 25.16 kg/m   Physical Exam CONSTITUTIONAL: NAD. EYES: Pupils are equal, round, , Sclera are non-icteric. EARS, NOSE, MOUTH AND THROAT: The oropharynx is clear. The oral mucosa is  pink and moist. Hearing is intact to voice. LYMPH NODES:  Lymph nodes in the neck are normal. RESPIRATORY:  Lungs are clear. There is normal respiratory effort, with equal breath sounds bilaterally, and without pathologic use of accessory muscles. CARDIOVASCULAR: Heart is regular without murmurs, gallops, or rubs. GI: The abdomen is  soft, nontender, and nondistended. There are no palpable masses. There is no hepatosplenomegaly. There are normal bowel sounds in all quadrants. GU: Rectal deferred.   MUSCULOSKELETAL: Normal muscle strength and tone. No cyanosis or edema.   SKIN: Turgor is  good and there are no pathologic skin lesions or ulcers. NEUROLOGIC: Motor and sensation is grossly normal. Cranial nerves are grossly intact. PSYCH:  Oriented to person, place and time. Affect is normal.  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  Gust with the patient in detail that this is a very slow-growing lesion.  I do not think that this necessarily needs to be excised and we can watch her with a repeat CT scan in 1 year.  Given that this is not a functional tumor it is unlikely that is related to some of her symptoms.  It is very unlikely to have an malignant component as well. Will see her back in a year with a repeat CT scan and evaluate the potential growth of the retroperitoneal nodule.  At this time she is in agreement and she is relief.  No need for surgical intervention.  I spent 40 minutes in this encounter including personally reviewing extensive medical records, imaging studies, coordinating her care and performing appropriate documentation   Sterling Big, MD FACS General Surgeon dermatitis

## 2022-12-26 ENCOUNTER — Ambulatory Visit (INDEPENDENT_AMBULATORY_CARE_PROVIDER_SITE_OTHER): Payer: 59 | Admitting: Internal Medicine

## 2022-12-26 ENCOUNTER — Encounter: Payer: Self-pay | Admitting: Internal Medicine

## 2022-12-26 VITALS — BP 116/78 | HR 70 | Ht 65.0 in | Wt 156.0 lb

## 2022-12-26 DIAGNOSIS — E1122 Type 2 diabetes mellitus with diabetic chronic kidney disease: Secondary | ICD-10-CM | POA: Diagnosis not present

## 2022-12-26 DIAGNOSIS — E1142 Type 2 diabetes mellitus with diabetic polyneuropathy: Secondary | ICD-10-CM | POA: Diagnosis not present

## 2022-12-26 DIAGNOSIS — E1165 Type 2 diabetes mellitus with hyperglycemia: Secondary | ICD-10-CM | POA: Insufficient documentation

## 2022-12-26 DIAGNOSIS — N1832 Chronic kidney disease, stage 3b: Secondary | ICD-10-CM | POA: Diagnosis not present

## 2022-12-26 DIAGNOSIS — D3500 Benign neoplasm of unspecified adrenal gland: Secondary | ICD-10-CM

## 2022-12-26 DIAGNOSIS — Z7984 Long term (current) use of oral hypoglycemic drugs: Secondary | ICD-10-CM

## 2022-12-26 LAB — BASIC METABOLIC PANEL
BUN: 20 mg/dL (ref 6–23)
CO2: 29 mEq/L (ref 19–32)
Calcium: 10.7 mg/dL — ABNORMAL HIGH (ref 8.4–10.5)
Chloride: 97 mEq/L (ref 96–112)
Creatinine, Ser: 1.26 mg/dL — ABNORMAL HIGH (ref 0.40–1.20)
GFR: 43.62 mL/min — ABNORMAL LOW (ref >60.00)
Glucose, Bld: 205 mg/dL — ABNORMAL HIGH (ref 70–99)
Potassium: 4.3 mEq/L (ref 3.5–5.1)
Sodium: 137 mEq/L (ref 135–145)

## 2022-12-26 LAB — HEMOGLOBIN A1C: Hgb A1c MFr Bld: 9.6 % — ABNORMAL HIGH (ref 4.6–6.5)

## 2022-12-26 MED ORDER — RYBELSUS 14 MG PO TABS: 14.0000 mg | ORAL_TABLET | Freq: Every day | ORAL | 3 refills | Status: AC

## 2022-12-26 MED ORDER — JARDIANCE 25 MG PO TABS: 25.0000 mg | ORAL_TABLET | Freq: Every day | ORAL | 3 refills | Status: AC

## 2022-12-26 NOTE — Progress Notes (Unsigned)
Name: BREALYNN MCALEXANDER  MRN/ DOB: [id number], 18-Jun-1956    Age/ Sex: 67 y.o., female    PCP: [facility]   Reason for Endocrinology Evaluation: Abdominal Mass      Date of Initial Endocrinology Evaluation: 12/26/2022     HPI: Ms. CAREY LONE is a 67 y.o. female with a past medical history of HTN, T2DM,. The patient presented for initial endocrinology clinic visit on 12/26/2022 for consultative assistance with her Abdominal mass .   The patient has been referred to endocrinology for further evaluation of abdominal mass, located medial to the right adrenal gland.  Per radiology report this mass has been noted since 2011 when it was 6 mm at the time  The mass is not otherwise from the adrenal gland   On her initial visit with me there was no evidence of hyperaldosteronism on labs 12/2021  24-hour urinary norepinephrine was slightly elevated at 102 mcg (reference 15-100) , normal total catecholamine, normal dopamine 88 mcg (reference 52-480).  Metanephrines normal 107 (90-315),  elevated normetanephrine 1109 mcg (reference 122-676) , 24-hour urinary cortisol was low at 2 mcg.  She was evaluated by surgery, and this was noted to be a very slow-growing lesion and no surgical intervention was recommended, a year follow-up with a CT scan was recommended.   Of note, the patient has CKD III  Anxiety has been improving  Denies palpitations  Denies constipation or diarrhea  Has occasional left hand tremors  Denies nausea, vomiting or diarrhea   Patient with a diagnosis of diabetes, she does acknowledge hypoglycemia and would like for me to check her glucose control today She was diagnosed with DM many years ago, checks glucose occasionally.   Avoids sugar sweetened beverages  She has not been consistent with Metformin intake    Glipizide 10 mg XL 1-2 times daily  Jardiance 25 mg daily  Rybelsus 7 mg daily  Metformin 500 mg XR 2 tabs BID     HISTORY:   Past Medical History:  Past Medical History:  Diagnosis Date   Arthritis    Asthma    Depression    Diabetes mellitus without complication (HCC)    GERD (gastroesophageal reflux disease)    High cholesterol    Hypertension    Liver lesion    Ulcer of abdomen wall (HCC)    Past Surgical History:  Past Surgical History:  Procedure Laterality Date   ABDOMINAL HYSTERECTOMY     CHOLECYSTECTOMY     COLONOSCOPY WITH PROPOFOL N/A 07/18/2019   Procedure: COLONOSCOPY WITH PROPOFOL;  Surgeon: Toney Reil, MD;  Location: [facility] ENDOSCOPY;  Service: Gastroenterology;  Laterality: N/A;   ESOPHAGOGASTRODUODENOSCOPY (EGD) WITH PROPOFOL N/A 07/18/2019   Procedure: ESOPHAGOGASTRODUODENOSCOPY (EGD) WITH PROPOFOL;  Surgeon: Toney Reil, MD;  Location: [facility] ENDOSCOPY;  Service: Gastroenterology;  Laterality: N/A;   LAPAROSCOPIC APPENDECTOMY N/A 08/19/2019   Procedure: APPENDECTOMY LAPAROSCOPIC;  Surgeon: Henrene Dodge, MD;  Location: [facility] ORS;  Service: General;  Laterality: N/A;    Social History:  reports that she has quit smoking. She has been exposed to tobacco smoke. She has never used smokeless tobacco. She reports that she does not drink alcohol and does not use drugs. Family History: family history includes Heart disease in her father and mother; Stomach cancer in her brother.   HOME MEDICATIONS: Allergies as of 12/26/2022       Reactions   Codeine Nausea And Vomiting        Medication  List        Accurate as of Dec 26, 2022  9:13 AM. If you have any questions, ask your nurse or doctor.          acetaminophen 650 MG CR tablet Commonly known as: TYLENOL Take 650-1,300 mg by mouth every 8 (eight) hours as needed for pain.   albuterol 108 (90 Base) MCG/ACT inhaler Commonly known as: VENTOLIN HFA Inhale 1-2 puffs into the lungs every 6 (six) hours as needed for wheezing or shortness of breath.   albuterol 108 (90 Base) MCG/ACT inhaler Commonly known as: VENTOLIN  HFA Inhale into the lungs every 6 (six) hours as needed for wheezing or shortness of breath.   atenolol 25 MG tablet Commonly known as: TENORMIN Take 25 mg by mouth daily.   cyclobenzaprine 10 MG tablet Commonly known as: FLEXERIL Take 1 tablet (10 mg total) by mouth 2 (two) times daily as needed for muscle spasms.   enalapril 10 MG tablet Commonly known as: VASOTEC Take 10 mg by mouth daily.   glipiZIDE XL 10 MG 24 hr tablet Generic drug: glipiZIDE Take 10 mg by mouth daily.   Jardiance 25 MG Tabs tablet Generic drug: empagliflozin Take 25 mg by mouth daily.   metFORMIN 500 MG 24 hr tablet Commonly known as: GLUCOPHAGE-XR Take 1,000 mg by mouth 2 (two) times daily.   omeprazole 20 MG capsule Commonly known as: PRILOSEC Take 20 mg by mouth daily.   rosuvastatin 10 MG tablet Commonly known as: CRESTOR Take 10 mg by mouth at bedtime.   Rybelsus 7 MG Tabs Generic drug: Semaglutide Take 3 mg by mouth daily.   sertraline 100 MG tablet Commonly known as: ZOLOFT Take 100 mg by mouth at bedtime.   spironolactone 50 MG tablet Commonly known as: ALDACTONE Take 50 mg by mouth daily.   triamterene-hydrochlorothiazide 75-50 MG tablet Commonly known as: MAXZIDE Take 1 tablet by mouth daily.   zolpidem 5 MG tablet Commonly known as: AMBIEN Take 5 mg by mouth at bedtime as needed for sleep.          REVIEW OF SYSTEMS: A comprehensive ROS was conducted with the patient and is negative except as per HPI     OBJECTIVE:  VS: There were no vitals taken for this visit.   Wt Readings from Last 3 Encounters:  12/11/22 151 lb 3.2 oz (68.6 kg)  11/29/22 153 lb 12.8 oz (69.8 kg)  11/01/22 156 lb (70.8 kg)     EXAM: General: Pt appears well and is in NAD  Neck: General: Supple without adenopathy. Thyroid: Thyroid size normal.  No goiter or nodules appreciated.   Lungs: Clear with good BS bilat with no rales, rhonchi, or wheezes  Heart: Auscultation: RRR.  Abdomen:  Normoactive bowel sounds, soft, nontender, without masses or organomegaly palpable  Extremities:  BL LE: No pretibial edema normal ROM and strength.  Mental Status: Judgment, insight: Intact Orientation: Oriented to time, place, and person Mood and affect: No depression, anxiety, or agitation   DM Foot Exam 12/26/2022  The skin of the feet is intact without sores or ulcerations. The pedal pulses are 2+ on right and 2+ on left. The sensation is decreased to a screening 5.07, 10 gram monofilament bilaterally   DATA REVIEWED:   Latest Reference Range & Units 12/26/22 14:22  Sodium 135 - 145 mEq/L 137  Potassium 3.5 - 5.1 mEq/L 4.3  Chloride 96 - 112 mEq/L 97  CO2 19 - 32 mEq/L 29  Glucose  70 - 99 mg/dL 045 (H)  BUN 6 - 23 mg/dL 20  Creatinine 4.09 - 8.11 mg/dL 9.14 (H)  Calcium 8.4 - 10.5 mg/dL 78.2 (H)  GFR >95.62 mL/min 43.62 (L)    Latest Reference Range & Units 12/26/22 14:22  MICROALB/CREAT RATIO 0.0 - 30.0 mg/g 1.7    Latest Reference Range & Units 12/26/22 14:22  Glucose 70 - 99 mg/dL 130 (H)  Hemoglobin Q6V 4.6 - 6.5 % 9.6 (H)  (H): Data is abnormally high     CT Abdomen 12/06/2022  Adrenals/Urinary Tract: Enhancing 2 cm suprarenal soft tissue nodule either adjacent to or arising from the right adrenal gland on image 39/7 demonstrates Hounsfield units 35 pre contrast administration, 94 postcontrast administration and 55 on delayed, washout characteristics which are compatible with an adenoma. This lesion has been present and slowly growing dating back to 2011 but is unchanged in size from most recent MRI Dec 19, 2021 and on the examination it did not demonstrate loss of signal on out of phase imaging noted at demonstrate hyperintense T2 signal throughout on prior MRI Dec 19, 2021. Additionally on prior Dotatate PET-CT there was no evidence of radiotracer uptake.   Left adrenal gland appears normal.    ASSESSMENT/PLAN/RECOMMENDATIONS:   T2DM, Poorly Controlled  with CKD III: A1c 9.6%   -Poorly controlled diabetes -Patient admits to imperfect adherence to metformin, will reduce the dose due to low GFR -Will increase Rybelsus as below -I will also switch her glipizide from XL to regular release to be taken twice daily as below, unfortunately, she has been taking glipizide 1 to 2 tablets daily, we discussed the -No changes to Jardiance   Medication Decrease metformin 500 mg twice daily Continue Jardiance 25 mg daily Increase Rybelsus 14 mg daily Switch glipizide 10 mg XL, to regular glipizide 10 mg before breakfast and before supper    2. Para-Adrenal Mass:  -Patient with right para adrenal mass. -This has been there since 2011 with very gradual increase -There was elevation of 24- hr norepinephrine and normetanephrine 2023 ( at < 2x the upper limit of normal -Dotatate scan ruled out paraganglioma -She was evaluated by surgery, but surgery was not recommended as it was thought to be a very slow-growing tumor -We have opted to continue to monitor the mass biochemically   Addendum: Contacted the patient 12/27/2022 and left a message with metformin and glipizide recommendations at 1615     Signed electronically by: Lyndle Herrlich, MD  [facility] Endocrinology  [facility] [address] [place] [place], [place] [address] Phone: [phone] FAX: [phone]   CC: [facility], [facility] [address] [place] [address] Phone: [phone] Fax: [phone]   Return to Endocrinology clinic as below: Future Appointments  Date Time Provider Department Center  12/26/2022  1:40 PM Sekai Nayak, Konrad Dolores, MD [facility] None

## 2022-12-26 NOTE — Patient Instructions (Signed)
Increase Rybelsus 14 mg , 1 tablet every morning Continue Jardiance 25 mg, 1 tablet every morning  I will let you know about Glipizide and Metformin dose after blood work     HOW TO TREAT LOW BLOOD SUGARS (Blood sugar LESS THAN 70 MG/DL) Please follow the RULE OF 15 for the treatment of hypoglycemia treatment (when your (blood sugars are less than 70 mg/dL)   STEP 1: Take 15 grams of carbohydrates when your blood sugar is low, which includes:  3-4 GLUCOSE TABS  OR 3-4 OZ OF JUICE OR REGULAR SODA OR ONE TUBE OF GLUCOSE GEL    STEP 2: RECHECK blood sugar in 15 MINUTES STEP 3: If your blood sugar is still low at the 15 minute recheck --> then, go back to STEP 1 and treat AGAIN with another 15 grams of carbohydrates.

## 2022-12-27 LAB — MICROALBUMIN / CREATININE URINE RATIO
Creatinine,U: 42.3 mg/dL
Microalb Creat Ratio: 1.7 mg/g (ref 0.0–30.0)
Microalb, Ur: <0.7 mg/dL (ref 0.0–1.9)

## 2022-12-27 MED ORDER — METFORMIN HCL ER 500 MG PO TB24: 1000.0000 mg | ORAL_TABLET | Freq: Every day | ORAL | 2 refills | Status: AC

## 2022-12-27 MED ORDER — GLIPIZIDE 10 MG PO TABS: 10.0000 mg | ORAL_TABLET | Freq: Two times a day (BID) | ORAL | 2 refills | Status: AC

## 2022-12-28 ENCOUNTER — Telehealth: Payer: Self-pay

## 2022-12-28 NOTE — Telephone Encounter (Signed)
    Patient calling for lab results 

## 2022-12-28 NOTE — Telephone Encounter (Signed)
Pt contacted office and was advised Let the patient know that her A1c was 9.6%, which is pretty high   Her kidney function is low but overall stable    I had already increased her Rybelsus to 14 mg when she was here in the office  Continue Jardiance  Decrease metformin to 2 tablets a day (she was on 4)  Please stop glipizide XL, and new prescription for regular glipizide 10 mg has been sent to the pharmacy to be taken 1 tablet before breakfast and 1 tablet before supper   Please make sure she understands that she has to take her medications before meals not after.

## 2022-12-28 NOTE — Telephone Encounter (Signed)
LMTRC   Jmiller,RMA

## 2023-03-28 ENCOUNTER — Encounter (HOSPITAL_COMMUNITY): Payer: Self-pay

## 2023-03-28 ENCOUNTER — Emergency Department (HOSPITAL_COMMUNITY)
Admission: EM | Admit: 2023-03-28 | Discharge: 2023-03-28 | Discharge status: Against Medical Advice | Payer: 59 | Attending: Emergency Medicine | Admitting: Emergency Medicine

## 2023-03-28 ENCOUNTER — Emergency Department (HOSPITAL_COMMUNITY): Payer: 59

## 2023-03-28 ENCOUNTER — Other Ambulatory Visit: Payer: Self-pay

## 2023-03-28 DIAGNOSIS — M25539 Pain in unspecified wrist: Secondary | ICD-10-CM | POA: Insufficient documentation

## 2023-03-28 DIAGNOSIS — Z5321 Procedure and treatment not carried out due to patient leaving prior to being seen by health care provider: Secondary | ICD-10-CM | POA: Diagnosis not present

## 2023-03-28 NOTE — ED Triage Notes (Signed)
Pt c/o wrist pain after peeling apples two days ago. States she can not move it.

## 2023-06-20 ENCOUNTER — Ambulatory Visit: Payer: 59 | Admitting: Internal Medicine

## 2023-06-20 NOTE — Progress Notes (Deleted)
Name: Heather Carter  MRN/ DOB: [id number], November 03, 1955    Age/ Sex: 67 y.o., female    PCP: [facility]   Reason for Endocrinology Evaluation: Abdominal Mass      Date of Initial Endocrinology Evaluation: 06/20/2023     HPI: [name] is a 67 y.o. female with a past medical history of HTN, T2DM,. The patient presented for initial endocrinology clinic visit on 06/20/2023 for consultative assistance with her Abdominal mass .   The patient has been referred to endocrinology for further evaluation of abdominal mass, located medial to the right adrenal gland.  Per radiology report this mass has been noted since 2011 when it was 6 mm at the time  The mass is not otherwise from the adrenal gland   On her initial visit with me there was no evidence of hyperaldosteronism on labs 12/2021  24-hour urinary norepinephrine was slightly elevated at 102 mcg (reference 15-100) , normal total catecholamine, normal dopamine 88 mcg (reference 52-480).  Metanephrines normal 107 (90-315),  elevated normetanephrine 1109 mcg (reference 122-676) , 24-hour urinary cortisol was low at 2 mcg.  She was evaluated by surgery, and this was noted to be a very slow-growing lesion and no surgical intervention was recommended, a year follow-up with a CT scan was recommended.   DIABETES HISTORY: She was diagnosed with DM many years ago   On initial visit for diabetes she had an A1c of 9.6%, she was on glipizide XL, Jardiance, Rybelsus and metformin.  I increase Rybelsus, switch glipizide from XL to regular formulation, continue metformin and Jardiance  SUBJECTIVE:   During the last visit (12/26/2022): A1c 9.6%  Today (06/20/23): Heather Carter is here for a follow up on diabetes management. She  checks her blood sugars *** times daily. The patient has *** had hypoglycemic episodes since the last clinic visit. The patient is *** symptomatic with these episodes.     HOME  DIABETES REGIMEN:  Glipizide 10 mg, 1 tab BID Jardiance 25 mg daily  Rybelsus 14 mg daily  Metformin 500 mg XR 1 tab BID    Statin: *** ACE-I/ARB: *** Prior Diabetic Education: ***   METER DOWNLOAD SUMMARY: Date range evaluated: *** Fingerstick Blood Glucose Tests = *** Average Number Tests/Day = *** Overall Mean FS Glucose = *** Standard Deviation = ***  BG Ranges: Low = *** High = ***   Hypoglycemic Events/30 Days: BG < 50 = *** Episodes of symptomatic severe hypoglycemia = ***    DIABETIC COMPLICATIONS: Microvascular complications:  CKD Denies:  Last Eye Exam: Completed   Macrovascular complications:   Denies: CAD, CVA, PVD  HISTORY:  Past Medical History:  Past Medical History:  Diagnosis Date   Arthritis    Asthma    Depression    Diabetes mellitus without complication (HCC)    GERD (gastroesophageal reflux disease)    High cholesterol    Hypertension    Liver lesion    Ulcer of abdomen wall (HCC)    Past Surgical History:  Past Surgical History:  Procedure Laterality Date   ABDOMINAL HYSTERECTOMY     CHOLECYSTECTOMY     COLONOSCOPY WITH PROPOFOL N/A 07/18/2019   Procedure: COLONOSCOPY WITH PROPOFOL;  Surgeon: Toney Reil, MD;  Location: [facility] ENDOSCOPY;  Service: Gastroenterology;  Laterality: N/A;   ESOPHAGOGASTRODUODENOSCOPY (EGD) WITH PROPOFOL N/A 07/18/2019   Procedure: ESOPHAGOGASTRODUODENOSCOPY (EGD) WITH PROPOFOL;  Surgeon: Toney Reil, MD;  Location: [facility] ENDOSCOPY;  Service: Gastroenterology;  Laterality: N/A;   LAPAROSCOPIC APPENDECTOMY N/A 08/19/2019   Procedure: APPENDECTOMY LAPAROSCOPIC;  Surgeon: Henrene Dodge, MD;  Location: [facility] ORS;  Service: General;  Laterality: N/A;    Social History:  reports that she has quit smoking. She has been exposed to tobacco smoke. She has never used smokeless tobacco. She reports that she does not drink alcohol and does not use drugs. Family History: family history includes Heart disease  in her father and mother; Stomach cancer in her brother.   HOME MEDICATIONS: Allergies as of 06/20/2023       Reactions   Codeine Nausea And Vomiting        Medication List        Accurate as of June 20, 2023 12:44 PM. If you have any questions, ask your nurse or doctor.          acetaminophen 650 MG CR tablet Commonly known as: TYLENOL Take 650-1,300 mg by mouth every 8 (eight) hours as needed for pain.   albuterol 108 (90 Base) MCG/ACT inhaler Commonly known as: VENTOLIN HFA Inhale 1-2 puffs into the lungs every 6 (six) hours as needed for wheezing or shortness of breath.   albuterol 108 (90 Base) MCG/ACT inhaler Commonly known as: VENTOLIN HFA Inhale into the lungs every 6 (six) hours as needed for wheezing or shortness of breath.   atenolol 25 MG tablet Commonly known as: TENORMIN Take 25 mg by mouth daily.   cyclobenzaprine 10 MG tablet Commonly known as: FLEXERIL Take 1 tablet (10 mg total) by mouth 2 (two) times daily as needed for muscle spasms.   enalapril 10 MG tablet Commonly known as: VASOTEC Take 10 mg by mouth daily.   glipiZIDE 10 MG tablet Commonly known as: GLUCOTROL Take 1 tablet (10 mg total) by mouth 2 (two) times daily before a meal.   Jardiance 25 MG Tabs tablet Generic drug: empagliflozin Take 1 tablet (25 mg total) by mouth daily.   metFORMIN 500 MG 24 hr tablet Commonly known as: GLUCOPHAGE-XR Take 2 tablets (1,000 mg total) by mouth daily with breakfast.   omeprazole 20 MG capsule Commonly known as: PRILOSEC Take 20 mg by mouth daily.   rosuvastatin 10 MG tablet Commonly known as: CRESTOR Take 10 mg by mouth at bedtime.   Rybelsus 14 MG Tabs Generic drug: Semaglutide Take 1 tablet (14 mg total) by mouth daily.   sertraline 100 MG tablet Commonly known as: ZOLOFT Take 100 mg by mouth at bedtime.   spironolactone 50 MG tablet Commonly known as: ALDACTONE Take 50 mg by mouth daily.   zolpidem 5 MG tablet Commonly  known as: AMBIEN Take 5 mg by mouth at bedtime as needed for sleep.          REVIEW OF SYSTEMS: A comprehensive ROS was conducted with the patient and is negative except as per HPI     OBJECTIVE:  VS: There were no vitals taken for this visit.   Wt Readings from Last 3 Encounters:  12/26/22 156 lb (70.8 kg)  12/11/22 151 lb 3.2 oz (68.6 kg)  11/29/22 153 lb 12.8 oz (69.8 kg)     EXAM: General: Pt appears well and is in NAD  Neck: General: Supple without adenopathy. Thyroid: Thyroid size normal.  No goiter or nodules appreciated.   Lungs: Clear with good BS bilat with no rales, rhonchi, or wheezes  Heart: Auscultation: RRR.  Abdomen: Normoactive bowel sounds, soft, nontender, without masses or organomegaly palpable  Extremities:  BL LE: No pretibial  edema normal ROM and strength.  Mental Status: Judgment, insight: Intact Orientation: Oriented to time, place, and person Mood and affect: No depression, anxiety, or agitation   DM Foot Exam 12/26/2022  The skin of the feet is intact without sores or ulcerations. The pedal pulses are 2+ on right and 2+ on left. The sensation is decreased to a screening 5.07, 10 gram monofilament bilaterally   DATA REVIEWED:   Latest Reference Range & Units 12/26/22 14:22  Sodium 135 - 145 mEq/L 137  Potassium 3.5 - 5.1 mEq/L 4.3  Chloride 96 - 112 mEq/L 97  CO2 19 - 32 mEq/L 29  Glucose 70 - 99 mg/dL 295 (H)  BUN 6 - 23 mg/dL 20  Creatinine 6.21 - 3.08 mg/dL 6.57 (H)  Calcium 8.4 - 10.5 mg/dL 84.6 (H)  GFR >96.29 mL/min 43.62 (L)    Latest Reference Range & Units 12/26/22 14:22  MICROALB/CREAT RATIO 0.0 - 30.0 mg/g 1.7    Latest Reference Range & Units 12/26/22 14:22  Glucose 70 - 99 mg/dL 528 (H)  Hemoglobin U1L 4.6 - 6.5 % 9.6 (H)  (H): Data is abnormally high     CT Abdomen 12/06/2022  Adrenals/Urinary Tract: Enhancing 2 cm suprarenal soft tissue nodule either adjacent to or arising from the right adrenal gland on  image 39/7 demonstrates Hounsfield units 35 pre contrast administration, 94 postcontrast administration and 55 on delayed, washout characteristics which are compatible with an adenoma. This lesion has been present and slowly growing dating back to 2011 but is unchanged in size from most recent MRI Dec 19, 2021 and on the examination it did not demonstrate loss of signal on out of phase imaging noted at demonstrate hyperintense T2 signal throughout on prior MRI Dec 19, 2021. Additionally on prior Dotatate PET-CT there was no evidence of radiotracer uptake.   Left adrenal gland appears normal.    ASSESSMENT/PLAN/RECOMMENDATIONS:    1) Type 2 Diabetes Mellitus, ***controlled, With CKD III complications - Most recent A1c of *** %. Goal A1c < 7.0 %.    Plan: MEDICATIONS:   metformin 500 mg twice daily Continue Jardiance 25 mg daily  Rybelsus 14 mg daily Glipizide 10 mg before breakfast and before supper  EDUCATION / INSTRUCTIONS: BG monitoring instructions: Patient is instructed to check her blood sugars 1 times a day. Call [facility] [place] [facility] clinic if: BG persistently < 70  I reviewed the Rule of 15 for the treatment of hypoglycemia in detail with the patient. Literature supplied.    2) Diabetic complications:  Eye: Does *** have known diabetic retinopathy.  Neuro/ Feet: Does *** have known diabetic peripheral neuropathy .  Renal: Patient does have known baseline CKD. @CAPHE @   is *** on an ACEI/ARB at present.        2. Para-Adrenal Mass:  -Patient with right para adrenal mass. -This has been there since 2011 with very gradual increase -There was elevation of 24- hr norepinephrine and normetanephrine 2023 ( at < 2x the upper limit of normal -Dotatate scan ruled out paraganglioma -She was evaluated by surgery, but surgery was not recommended as it was thought to be a very slow-growing tumor -We have opted to continue to monitor the mass biochemically   Addendum:  Contacted the patient 12/27/2022 and left a message with metformin and glipizide recommendations at 1615     Signed electronically by: Lyndle Herrlich, MD  [facility] Endocrinology  [facility] [address] [place] [place], [place] [address] Phone: [phone]  FAX: [phone]   CC: [facility], [facility] [address] [place] [address] Phone: [phone] Fax: [phone]   Return to Endocrinology clinic as below: Future Appointments  Date Time Provider Department Center  06/20/2023  2:40 PM Daemon Dowty, Konrad Dolores, MD [facility] None

## 2023-11-07 ENCOUNTER — Other Ambulatory Visit: Payer: Self-pay

## 2023-11-07 DIAGNOSIS — E278 Other specified disorders of adrenal gland: Secondary | ICD-10-CM

## 2023-11-23 ENCOUNTER — Other Ambulatory Visit: Payer: Self-pay | Admitting: Family Medicine

## 2023-11-23 DIAGNOSIS — Z1231 Encounter for screening mammogram for malignant neoplasm of breast: Secondary | ICD-10-CM

## 2023-11-28 ENCOUNTER — Ambulatory Visit

## 2023-12-10 ENCOUNTER — Ambulatory Visit: Admitting: Surgery

## 2024-03-10 ENCOUNTER — Ambulatory Visit (HOSPITAL_COMMUNITY)
Admission: RE | Admit: 2024-03-10 | Discharge: 2024-03-10 | Disposition: A | Discharge status: Home / Self Care | Source: Ambulatory Visit | Attending: Family Medicine | Admitting: Family Medicine

## 2024-03-10 DIAGNOSIS — Z1231 Encounter for screening mammogram for malignant neoplasm of breast: Secondary | ICD-10-CM | POA: Diagnosis present
